# Patient Record
Sex: Female | Born: 1940 | Race: White | Hispanic: No | Marital: Married | Smoking: Never smoker
Health system: Southern US, Community
[De-identification: ages and names within clinical notes are randomized; demographics above are authoritative.]

## PROBLEM LIST (undated history)

## (undated) DIAGNOSIS — E7800 Pure hypercholesterolemia, unspecified: Secondary | ICD-10-CM

## (undated) DIAGNOSIS — C189 Malignant neoplasm of colon, unspecified: Secondary | ICD-10-CM

## (undated) DIAGNOSIS — M8580 Other specified disorders of bone density and structure, unspecified site: Secondary | ICD-10-CM

## (undated) DIAGNOSIS — F329 Major depressive disorder, single episode, unspecified: Secondary | ICD-10-CM

## (undated) DIAGNOSIS — R55 Syncope and collapse: Secondary | ICD-10-CM

## (undated) DIAGNOSIS — D721 Eosinophilia, unspecified: Secondary | ICD-10-CM

## (undated) DIAGNOSIS — T7840XA Allergy, unspecified, initial encounter: Secondary | ICD-10-CM

## (undated) DIAGNOSIS — R011 Cardiac murmur, unspecified: Secondary | ICD-10-CM

## (undated) DIAGNOSIS — I341 Nonrheumatic mitral (valve) prolapse: Secondary | ICD-10-CM

## (undated) DIAGNOSIS — F32A Depression, unspecified: Secondary | ICD-10-CM

## (undated) DIAGNOSIS — C50919 Malignant neoplasm of unspecified site of unspecified female breast: Secondary | ICD-10-CM

## (undated) DIAGNOSIS — J45909 Unspecified asthma, uncomplicated: Secondary | ICD-10-CM

## (undated) DIAGNOSIS — M1990 Unspecified osteoarthritis, unspecified site: Secondary | ICD-10-CM

## (undated) HISTORY — DX: Cardiac murmur, unspecified: R01.1

## (undated) HISTORY — DX: Major depressive disorder, single episode, unspecified: F32.9

## (undated) HISTORY — DX: Eosinophilia: D72.1

## (undated) HISTORY — PX: BACK SURGERY: SHX140

## (undated) HISTORY — DX: Depression, unspecified: F32.A

## (undated) HISTORY — PX: BREAST SURGERY: SHX581

## (undated) HISTORY — DX: Syncope and collapse: R55

## (undated) HISTORY — DX: Other specified disorders of bone density and structure, unspecified site: M85.80

## (undated) HISTORY — PX: COSMETIC SURGERY: SHX468

## (undated) HISTORY — PX: OTHER SURGICAL HISTORY: SHX169

## (undated) HISTORY — PX: ROTATOR CUFF REPAIR: SHX139

## (undated) HISTORY — DX: Unspecified asthma, uncomplicated: J45.909

## (undated) HISTORY — PX: COLON RESECTION: SHX5231

## (undated) HISTORY — DX: Allergy, unspecified, initial encounter: T78.40XA

## (undated) HISTORY — DX: Unspecified osteoarthritis, unspecified site: M19.90

## (undated) HISTORY — DX: Malignant neoplasm of colon, unspecified: C18.9

## (undated) HISTORY — DX: Malignant neoplasm of unspecified site of unspecified female breast: C50.919

## (undated) HISTORY — PX: COLON SURGERY: SHX602

## (undated) HISTORY — DX: Eosinophilia, unspecified: D72.10

## (undated) HISTORY — DX: Pure hypercholesterolemia, unspecified: E78.00

## (undated) HISTORY — DX: Nonrheumatic mitral (valve) prolapse: I34.1

## (undated) HISTORY — PX: SPINE SURGERY: SHX786

---

## 1968-03-18 HISTORY — PX: APPENDECTOMY: SHX54

## 1977-03-18 HISTORY — PX: VAGINAL HYSTERECTOMY: SUR661

## 1997-09-01 ENCOUNTER — Other Ambulatory Visit: Admission: RE | Admit: 1997-09-01 | Discharge: 1997-09-01 | Payer: Self-pay | Admitting: Cardiology

## 1998-01-26 ENCOUNTER — Other Ambulatory Visit: Admission: RE | Admit: 1998-01-26 | Discharge: 1998-01-26 | Payer: Self-pay | Admitting: Obstetrics and Gynecology

## 1998-03-18 HISTORY — PX: SHOULDER SURGERY: SHX246

## 1999-03-19 DIAGNOSIS — C50919 Malignant neoplasm of unspecified site of unspecified female breast: Secondary | ICD-10-CM

## 1999-03-19 HISTORY — DX: Malignant neoplasm of unspecified site of unspecified female breast: C50.919

## 1999-03-19 HISTORY — PX: MASTECTOMY: SHX3

## 1999-09-13 ENCOUNTER — Encounter: Admission: RE | Admit: 1999-09-13 | Discharge: 1999-09-13 | Payer: Self-pay | Admitting: Cardiology

## 1999-09-13 ENCOUNTER — Encounter (INDEPENDENT_AMBULATORY_CARE_PROVIDER_SITE_OTHER): Payer: Self-pay | Admitting: Specialist

## 1999-09-13 ENCOUNTER — Encounter: Payer: Self-pay | Admitting: Cardiology

## 1999-09-13 ENCOUNTER — Other Ambulatory Visit: Admission: RE | Admit: 1999-09-13 | Discharge: 1999-09-13 | Payer: Self-pay | Admitting: Cardiology

## 1999-09-21 ENCOUNTER — Encounter: Payer: Self-pay | Admitting: General Surgery

## 1999-09-24 ENCOUNTER — Inpatient Hospital Stay (HOSPITAL_COMMUNITY): Admission: RE | Admit: 1999-09-24 | Discharge: 1999-09-27 | Payer: Self-pay | Admitting: General Surgery

## 1999-09-24 ENCOUNTER — Encounter: Payer: Self-pay | Admitting: General Surgery

## 1999-10-08 ENCOUNTER — Ambulatory Visit (HOSPITAL_COMMUNITY): Admission: RE | Admit: 1999-10-08 | Discharge: 1999-10-08 | Payer: Self-pay | Admitting: *Deleted

## 1999-10-08 ENCOUNTER — Encounter: Payer: Self-pay | Admitting: *Deleted

## 1999-10-15 ENCOUNTER — Encounter: Payer: Self-pay | Admitting: General Surgery

## 1999-10-15 ENCOUNTER — Ambulatory Visit (HOSPITAL_BASED_OUTPATIENT_CLINIC_OR_DEPARTMENT_OTHER): Admission: RE | Admit: 1999-10-15 | Discharge: 1999-10-15 | Payer: Self-pay | Admitting: General Surgery

## 1999-12-13 ENCOUNTER — Inpatient Hospital Stay (HOSPITAL_COMMUNITY): Admission: EM | Admit: 1999-12-13 | Discharge: 1999-12-17 | Payer: Self-pay | Admitting: Emergency Medicine

## 1999-12-14 ENCOUNTER — Encounter: Payer: Self-pay | Admitting: Oncology

## 2000-01-01 ENCOUNTER — Encounter: Payer: Self-pay | Admitting: *Deleted

## 2000-01-01 ENCOUNTER — Encounter: Admission: RE | Admit: 2000-01-01 | Discharge: 2000-01-01 | Payer: Self-pay | Admitting: *Deleted

## 2000-02-22 ENCOUNTER — Encounter: Admission: RE | Admit: 2000-02-22 | Discharge: 2000-05-22 | Payer: Self-pay | Admitting: Oncology

## 2000-03-20 ENCOUNTER — Ambulatory Visit (HOSPITAL_BASED_OUTPATIENT_CLINIC_OR_DEPARTMENT_OTHER): Admission: RE | Admit: 2000-03-20 | Discharge: 2000-03-20 | Payer: Self-pay | Admitting: General Surgery

## 2000-04-17 ENCOUNTER — Other Ambulatory Visit: Admission: RE | Admit: 2000-04-17 | Discharge: 2000-04-17 | Payer: Self-pay | Admitting: Obstetrics and Gynecology

## 2000-04-24 ENCOUNTER — Encounter: Admission: RE | Admit: 2000-04-24 | Discharge: 2000-04-24 | Payer: Self-pay | Admitting: *Deleted

## 2000-04-24 ENCOUNTER — Encounter: Payer: Self-pay | Admitting: *Deleted

## 2000-07-17 ENCOUNTER — Encounter: Payer: Self-pay | Admitting: *Deleted

## 2000-07-17 ENCOUNTER — Ambulatory Visit (HOSPITAL_COMMUNITY): Admission: RE | Admit: 2000-07-17 | Discharge: 2000-07-17 | Payer: Self-pay | Admitting: *Deleted

## 2000-07-30 ENCOUNTER — Ambulatory Visit (HOSPITAL_COMMUNITY): Admission: RE | Admit: 2000-07-30 | Discharge: 2000-07-30 | Payer: Self-pay | Admitting: *Deleted

## 2000-07-30 ENCOUNTER — Encounter: Payer: Self-pay | Admitting: *Deleted

## 2001-04-16 ENCOUNTER — Other Ambulatory Visit: Admission: RE | Admit: 2001-04-16 | Discharge: 2001-04-16 | Payer: Self-pay | Admitting: Obstetrics and Gynecology

## 2001-04-22 ENCOUNTER — Encounter (INDEPENDENT_AMBULATORY_CARE_PROVIDER_SITE_OTHER): Payer: Self-pay | Admitting: *Deleted

## 2001-04-22 ENCOUNTER — Ambulatory Visit (HOSPITAL_COMMUNITY): Admission: RE | Admit: 2001-04-22 | Discharge: 2001-04-22 | Payer: Self-pay | Admitting: Gastroenterology

## 2001-04-27 ENCOUNTER — Encounter: Admission: RE | Admit: 2001-04-27 | Discharge: 2001-04-27 | Payer: Self-pay | Admitting: General Surgery

## 2001-04-27 ENCOUNTER — Encounter: Payer: Self-pay | Admitting: General Surgery

## 2001-11-24 ENCOUNTER — Encounter: Admission: RE | Admit: 2001-11-24 | Discharge: 2001-11-24 | Payer: Self-pay | Admitting: Gastroenterology

## 2001-11-24 ENCOUNTER — Encounter: Payer: Self-pay | Admitting: Gastroenterology

## 2001-12-07 ENCOUNTER — Encounter (INDEPENDENT_AMBULATORY_CARE_PROVIDER_SITE_OTHER): Payer: Self-pay | Admitting: Specialist

## 2001-12-07 ENCOUNTER — Ambulatory Visit (HOSPITAL_COMMUNITY): Admission: RE | Admit: 2001-12-07 | Discharge: 2001-12-07 | Payer: Self-pay | Admitting: Gastroenterology

## 2001-12-30 ENCOUNTER — Encounter: Payer: Self-pay | Admitting: Gastroenterology

## 2001-12-30 ENCOUNTER — Encounter: Admission: RE | Admit: 2001-12-30 | Discharge: 2001-12-30 | Payer: Self-pay | Admitting: Gastroenterology

## 2002-05-11 ENCOUNTER — Encounter: Payer: Self-pay | Admitting: General Surgery

## 2002-05-11 ENCOUNTER — Encounter: Admission: RE | Admit: 2002-05-11 | Discharge: 2002-05-11 | Payer: Self-pay | Admitting: General Surgery

## 2003-05-16 ENCOUNTER — Encounter: Admission: RE | Admit: 2003-05-16 | Discharge: 2003-05-16 | Payer: Self-pay | Admitting: General Surgery

## 2003-06-08 ENCOUNTER — Other Ambulatory Visit: Admission: RE | Admit: 2003-06-08 | Discharge: 2003-06-08 | Payer: Self-pay | Admitting: Obstetrics and Gynecology

## 2003-07-29 ENCOUNTER — Ambulatory Visit (HOSPITAL_COMMUNITY): Admission: RE | Admit: 2003-07-29 | Discharge: 2003-07-29 | Payer: Self-pay | Admitting: Oncology

## 2004-05-29 ENCOUNTER — Encounter: Admission: RE | Admit: 2004-05-29 | Discharge: 2004-05-29 | Payer: Self-pay | Admitting: General Surgery

## 2004-06-11 ENCOUNTER — Other Ambulatory Visit: Admission: RE | Admit: 2004-06-11 | Discharge: 2004-06-11 | Payer: Self-pay | Admitting: Addiction Medicine

## 2004-07-27 ENCOUNTER — Ambulatory Visit: Payer: Self-pay | Admitting: Oncology

## 2004-08-08 ENCOUNTER — Ambulatory Visit (HOSPITAL_COMMUNITY): Admission: RE | Admit: 2004-08-08 | Discharge: 2004-08-08 | Payer: Self-pay | Admitting: Oncology

## 2005-05-31 ENCOUNTER — Encounter: Admission: RE | Admit: 2005-05-31 | Discharge: 2005-05-31 | Payer: Self-pay | Admitting: Obstetrics and Gynecology

## 2005-06-12 ENCOUNTER — Other Ambulatory Visit: Admission: RE | Admit: 2005-06-12 | Discharge: 2005-06-12 | Payer: Self-pay | Admitting: Addiction Medicine

## 2005-09-23 ENCOUNTER — Ambulatory Visit: Payer: Self-pay | Admitting: Oncology

## 2005-09-30 LAB — CBC WITH DIFFERENTIAL/PLATELET
BASO%: 0.6 % (ref 0.0–2.0)
EOS%: 5.4 % (ref 0.0–7.0)
MCH: 31 pg (ref 26.0–34.0)
MCHC: 34.7 g/dL (ref 32.0–36.0)
MCV: 89.3 fL (ref 81.0–101.0)
MONO%: 7.8 % (ref 0.0–13.0)
RBC: 4.64 10*6/uL (ref 3.70–5.32)
RDW: 12.2 % (ref 11.3–14.5)
lymph#: 2 10*3/uL (ref 0.9–3.3)

## 2005-10-01 ENCOUNTER — Ambulatory Visit (HOSPITAL_COMMUNITY): Admission: RE | Admit: 2005-10-01 | Discharge: 2005-10-01 | Payer: Self-pay | Admitting: Oncology

## 2005-10-03 LAB — COMPREHENSIVE METABOLIC PANEL
ALT: 21 U/L (ref 0–40)
AST: 27 U/L (ref 0–37)
Albumin: 4.6 g/dL (ref 3.5–5.2)
Alkaline Phosphatase: 54 U/L (ref 39–117)
BUN: 15 mg/dL (ref 6–23)
Calcium: 9.9 mg/dL (ref 8.4–10.5)
Chloride: 102 mEq/L (ref 96–112)
Potassium: 4.2 mEq/L (ref 3.5–5.3)

## 2005-10-03 LAB — IMMUNOFIXATION ELECTROPHORESIS
IgA: 113 mg/dL (ref 68–378)
IgM, Serum: 143 mg/dL (ref 60–263)
Total Protein, Serum Electrophoresis: 7.3 g/dL (ref 6.0–8.3)

## 2005-10-03 LAB — URIC ACID: Uric Acid, Serum: 5.4 mg/dL (ref 2.4–7.0)

## 2006-06-09 ENCOUNTER — Encounter: Admission: RE | Admit: 2006-06-09 | Discharge: 2006-06-09 | Payer: Self-pay | Admitting: General Surgery

## 2006-10-23 ENCOUNTER — Encounter (INDEPENDENT_AMBULATORY_CARE_PROVIDER_SITE_OTHER): Payer: Self-pay | Admitting: Orthopedic Surgery

## 2006-10-23 ENCOUNTER — Ambulatory Visit (HOSPITAL_BASED_OUTPATIENT_CLINIC_OR_DEPARTMENT_OTHER): Admission: RE | Admit: 2006-10-23 | Discharge: 2006-10-23 | Payer: Self-pay | Admitting: Orthopedic Surgery

## 2007-06-16 ENCOUNTER — Encounter: Admission: RE | Admit: 2007-06-16 | Discharge: 2007-06-16 | Payer: Self-pay | Admitting: Cardiology

## 2007-07-07 ENCOUNTER — Encounter: Admission: RE | Admit: 2007-07-07 | Discharge: 2007-07-07 | Payer: Self-pay | Admitting: Cardiology

## 2007-07-14 ENCOUNTER — Other Ambulatory Visit: Admission: RE | Admit: 2007-07-14 | Discharge: 2007-07-14 | Payer: Self-pay | Admitting: Obstetrics and Gynecology

## 2007-12-14 ENCOUNTER — Ambulatory Visit: Payer: Self-pay | Admitting: Internal Medicine

## 2007-12-14 DIAGNOSIS — R05 Cough: Secondary | ICD-10-CM

## 2007-12-14 DIAGNOSIS — R059 Cough, unspecified: Secondary | ICD-10-CM | POA: Insufficient documentation

## 2007-12-14 DIAGNOSIS — Z853 Personal history of malignant neoplasm of breast: Secondary | ICD-10-CM

## 2007-12-14 DIAGNOSIS — I059 Rheumatic mitral valve disease, unspecified: Secondary | ICD-10-CM

## 2007-12-14 DIAGNOSIS — J45909 Unspecified asthma, uncomplicated: Secondary | ICD-10-CM | POA: Insufficient documentation

## 2007-12-14 DIAGNOSIS — J309 Allergic rhinitis, unspecified: Secondary | ICD-10-CM | POA: Insufficient documentation

## 2007-12-14 DIAGNOSIS — E782 Mixed hyperlipidemia: Secondary | ICD-10-CM

## 2008-01-13 ENCOUNTER — Ambulatory Visit: Payer: Self-pay | Admitting: Internal Medicine

## 2008-01-13 DIAGNOSIS — R0602 Shortness of breath: Secondary | ICD-10-CM | POA: Insufficient documentation

## 2008-01-19 ENCOUNTER — Ambulatory Visit: Payer: Self-pay | Admitting: Internal Medicine

## 2008-01-19 LAB — CONVERTED CEMR LAB
BUN: 11 mg/dL (ref 6–23)
Calcium: 10.6 mg/dL — ABNORMAL HIGH (ref 8.4–10.5)
Creatinine, Ser: 0.8 mg/dL (ref 0.4–1.2)
Glucose, Bld: 87 mg/dL (ref 70–99)

## 2008-01-25 ENCOUNTER — Ambulatory Visit: Payer: Self-pay | Admitting: Cardiology

## 2008-01-29 ENCOUNTER — Telehealth: Payer: Self-pay | Admitting: Internal Medicine

## 2008-02-01 ENCOUNTER — Telehealth: Payer: Self-pay | Admitting: Internal Medicine

## 2008-02-02 ENCOUNTER — Telehealth: Payer: Self-pay | Admitting: Internal Medicine

## 2008-04-18 ENCOUNTER — Ambulatory Visit: Payer: Self-pay | Admitting: Internal Medicine

## 2008-07-11 ENCOUNTER — Encounter: Admission: RE | Admit: 2008-07-11 | Discharge: 2008-07-11 | Payer: Self-pay | Admitting: Cardiology

## 2008-08-30 ENCOUNTER — Ambulatory Visit: Payer: Self-pay | Admitting: Obstetrics and Gynecology

## 2008-08-30 ENCOUNTER — Other Ambulatory Visit: Admission: RE | Admit: 2008-08-30 | Discharge: 2008-08-30 | Payer: Self-pay | Admitting: Obstetrics and Gynecology

## 2008-08-30 ENCOUNTER — Encounter: Payer: Self-pay | Admitting: Obstetrics and Gynecology

## 2008-12-20 ENCOUNTER — Encounter: Admission: RE | Admit: 2008-12-20 | Discharge: 2008-12-20 | Payer: Self-pay | Admitting: Cardiology

## 2009-04-17 ENCOUNTER — Encounter: Admission: RE | Admit: 2009-04-17 | Discharge: 2009-04-17 | Payer: Self-pay | Admitting: Surgery

## 2009-06-07 ENCOUNTER — Ambulatory Visit: Payer: Self-pay | Admitting: Obstetrics and Gynecology

## 2009-06-08 ENCOUNTER — Ambulatory Visit: Payer: Self-pay | Admitting: Obstetrics and Gynecology

## 2009-07-17 ENCOUNTER — Encounter: Admission: RE | Admit: 2009-07-17 | Discharge: 2009-07-17 | Payer: Self-pay | Admitting: Obstetrics and Gynecology

## 2009-08-08 ENCOUNTER — Ambulatory Visit: Payer: Self-pay | Admitting: Obstetrics and Gynecology

## 2009-10-09 ENCOUNTER — Ambulatory Visit: Payer: Self-pay | Admitting: Obstetrics and Gynecology

## 2009-10-09 ENCOUNTER — Other Ambulatory Visit: Admission: RE | Admit: 2009-10-09 | Discharge: 2009-10-09 | Payer: Self-pay | Admitting: Obstetrics and Gynecology

## 2009-11-06 ENCOUNTER — Ambulatory Visit: Payer: Self-pay | Admitting: Obstetrics and Gynecology

## 2009-12-12 ENCOUNTER — Ambulatory Visit: Payer: Self-pay | Admitting: Cardiology

## 2009-12-13 ENCOUNTER — Ambulatory Visit: Payer: Self-pay | Admitting: Cardiology

## 2009-12-20 ENCOUNTER — Ambulatory Visit: Payer: Self-pay | Admitting: Cardiology

## 2009-12-20 ENCOUNTER — Ambulatory Visit: Admission: RE | Admit: 2009-12-20 | Discharge: 2009-12-20 | Payer: Self-pay | Admitting: Gynecologic Oncology

## 2010-03-18 DIAGNOSIS — C189 Malignant neoplasm of colon, unspecified: Secondary | ICD-10-CM

## 2010-03-18 HISTORY — DX: Malignant neoplasm of colon, unspecified: C18.9

## 2010-05-01 ENCOUNTER — Ambulatory Visit (INDEPENDENT_AMBULATORY_CARE_PROVIDER_SITE_OTHER): Payer: Medicare Other | Admitting: Cardiology

## 2010-05-01 DIAGNOSIS — E7800 Pure hypercholesterolemia, unspecified: Secondary | ICD-10-CM

## 2010-05-01 DIAGNOSIS — Z79899 Other long term (current) drug therapy: Secondary | ICD-10-CM

## 2010-05-01 DIAGNOSIS — R55 Syncope and collapse: Secondary | ICD-10-CM

## 2010-05-08 ENCOUNTER — Other Ambulatory Visit: Payer: MEDICARE

## 2010-05-08 ENCOUNTER — Ambulatory Visit (INDEPENDENT_AMBULATORY_CARE_PROVIDER_SITE_OTHER): Payer: MEDICARE | Admitting: Obstetrics and Gynecology

## 2010-05-08 DIAGNOSIS — D3910 Neoplasm of uncertain behavior of unspecified ovary: Secondary | ICD-10-CM

## 2010-05-08 DIAGNOSIS — N83209 Unspecified ovarian cyst, unspecified side: Secondary | ICD-10-CM

## 2010-05-08 DIAGNOSIS — N949 Unspecified condition associated with female genital organs and menstrual cycle: Secondary | ICD-10-CM

## 2010-06-28 ENCOUNTER — Other Ambulatory Visit: Payer: Self-pay | Admitting: Obstetrics and Gynecology

## 2010-06-28 DIAGNOSIS — Z1231 Encounter for screening mammogram for malignant neoplasm of breast: Secondary | ICD-10-CM

## 2010-07-03 ENCOUNTER — Other Ambulatory Visit: Payer: Self-pay | Admitting: *Deleted

## 2010-07-03 MED ORDER — BUTALBITAL-ASPIRIN-CAFFEINE 50-325-40 MG PO CAPS: 1.0000 | ORAL_CAPSULE | ORAL | Status: DC | PRN

## 2010-07-03 NOTE — Telephone Encounter (Signed)
Refill request faxed to [place], faxed signed authorization back

## 2010-07-11 ENCOUNTER — Telehealth: Payer: Self-pay | Admitting: Cardiology

## 2010-07-11 DIAGNOSIS — R42 Dizziness and giddiness: Secondary | ICD-10-CM

## 2010-07-11 MED ORDER — MECLIZINE HCL 25 MG PO TABS: 25.0000 mg | ORAL_TABLET | Freq: Three times a day (TID) | ORAL | Status: DC | PRN

## 2010-07-11 NOTE — Telephone Encounter (Signed)
Very dizzy, worse with movement.  Spoke with Lawson Fiscal and will work she will see her Friday and ok to try Meclizine PRN.  Symptoms have been going on for a couple of weeks

## 2010-07-11 NOTE — Telephone Encounter (Signed)
Patient says that she is very dizzy and stumbling around.  She wants to know if she should come in to be seen.

## 2010-07-13 ENCOUNTER — Encounter: Payer: Self-pay | Admitting: Nurse Practitioner

## 2010-07-13 ENCOUNTER — Ambulatory Visit (INDEPENDENT_AMBULATORY_CARE_PROVIDER_SITE_OTHER): Payer: MEDICARE | Admitting: Nurse Practitioner

## 2010-07-13 VITALS — BP 138/80 | HR 72 | Ht 64.0 in | Wt 140.0 lb

## 2010-07-13 DIAGNOSIS — I059 Rheumatic mitral valve disease, unspecified: Secondary | ICD-10-CM

## 2010-07-13 DIAGNOSIS — H8110 Benign paroxysmal vertigo, unspecified ear: Secondary | ICD-10-CM

## 2010-07-13 DIAGNOSIS — IMO0002 Reserved for concepts with insufficient information to code with codable children: Secondary | ICD-10-CM

## 2010-07-13 NOTE — Progress Notes (Signed)
   Shelby [name] Date of Birth: 09-20-1940   History of Present Illness: Shelby [name] is seen today for a work in visit. She is seen for Dr. Patty Sermons. She has been dizzy for the past several weeks. It has gotten progressively worse since she had a syncopal episode back in February. It has gotten really worse over the past two weeks. She is dizzy with lying down, turning over and even turning her head. Her right ear feels stopped up. She denies palpitations, chest pain or shortness of breath. She has had no further syncope. She was given some Meclizine and it has helped her but she is not taking it regularly.   Current Outpatient Prescriptions on File Prior to Visit  Medication Sig Dispense Refill  . butalbital-aspirin-caffeine (FIORINAL) 50-325-40 MG per capsule Take 1 capsule by mouth every 4 (four) hours as needed for headache.  30 capsule  5  . meclizine (ANTIVERT) 25 MG tablet Take 1 tablet (25 mg total) by mouth 3 (three) times daily as needed for dizziness.  30 tablet  0    Allergies  Allergen Reactions  . Benzonatate     REACTION: migraines  . Codeine     REACTION: Nausea  . Latex   . Penicillins   . Sulfonamide Derivatives     Past Medical History  Diagnosis Date  . MVP (mitral valve prolapse)   . Migraine   . Breast cancer 2001    s/p surgery and chemo   . Eosinophilia   . Syncope     secondary to vasovagal phenomenon  . Hypercholesterolemia     statin intolerant  . Ovarian cyst     Past Surgical History  Procedure Date  . Mastectomy     History  Smoking status  . Never Smoker   Smokeless tobacco  . Never Used    History  Alcohol Use No    Family History  Problem Relation Age of Onset  . Arthritis Mother   . Heart disease Father     Review of Systems: The review of systems is positive for positional vertigo. She denies chest pain or shortness of breath. She is not having palpitations She has had no further passing out spells.  All other systems  were reviewed and are negative.  Physical Exam: BP 138/80  Pulse 72  Ht 5\' 4"  (1.626 m)  Wt 140 lb (63.504 kg)  BMI 24.03 kg/m2 Patient is very pleasant and in no acute distress. She looks much younger than her stated age. Skin is warm and dry. Color is normal.  HEENT is unremarkable. Normocephalic/atraumatic. PERRL. Sclera are nonicteric. Neck is supple. No masses. No JVD. Lungs are clear. Cardiac exam shows a regular rate and rhythm.I could not appreciate a click today.  Abdomen is soft. Extremities are without edema. Gait and ROM are intact. No gross neurologic deficits noted.  LABORATORY DATA: N/A   Assessment / Plan:

## 2010-07-13 NOTE — Assessment & Plan Note (Signed)
She did not have MVP by her last echo in 2005. May need to consider updating at her next appointment.

## 2010-07-13 NOTE — Patient Instructions (Signed)
Use the meclizine three times a day Try your valium at night for the next few nights. Call for any problems.

## 2010-07-13 NOTE — Assessment & Plan Note (Signed)
Her symptoms are consistent with positional vertigo. I will have her use the Meclizine TID regularly. I have asked her to use her Valium at night as well. I think her symptoms will resolve with time. Patient is agreeable to this plan and will call if any problems develop in the interim.

## 2010-07-25 ENCOUNTER — Telehealth: Payer: Self-pay | Admitting: Cardiology

## 2010-07-25 NOTE — Telephone Encounter (Addendum)
PT SAW LORI RECENTLY AND TOLD HAD VERTIGO. PT HAS TAKEN 32 Meclizine  AND NO BETTER, NO SURE WHAT SHE NEEDS TO DO NOW. CHART PLACED IN BOX.

## 2010-07-25 NOTE — Telephone Encounter (Signed)
Discussed with Lawson Fiscal and will refer to ENT.  Will call tomorrow for an appointment

## 2010-07-26 ENCOUNTER — Telehealth: Payer: Self-pay | Admitting: *Deleted

## 2010-07-26 NOTE — Telephone Encounter (Signed)
Spoke with patient re- appt. With ENT Dr. Narda Bonds May 24th at 1:00,if she gets any worse re- vertigo go to urgent care

## 2010-07-31 NOTE — Op Note (Signed)
NAMEELESA, GARMAN             ACCOUNT NO.:  [id number]   MEDICAL RECORD NO.:  [id number]          PATIENT TYPE:  AMB   LOCATION:  DSC                          FACILITY:  [facility]   PHYSICIAN:  Katy Fitch. Sypher, M.D. DATE OF BIRTH:  Jul 17, 1940   DATE OF PROCEDURE:  10/23/2006  DATE OF DISCHARGE:                               OPERATIVE REPORT   PREOPERATIVE DIAGNOSIS:  Painful swollen right small finger DIP joint,  rule out mucoid cyst, with preoperative x-rays revealing bone-on-bone  arthropathy.   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   OPERATION:  1. Debridement of right small finger DIP joint with synovectomy.  2. Resection of marginal osteophytes at base of distal phalanx right      small finger.   SURGEON:  Katy Fitch. Sypher, M.D.   ASSISTANT:  Marveen Reeks. Dasnoit, P.A.-C.   ANESTHESIA:  2% lidocaine metacarpal head level block of right small  finger.   SUPERVISING ANESTHESIOLOGIST:  Janetta Hora. Gelene Mink, M.D.   INDICATIONS:  Girl Schissler is a 70 year old right handed woman  referred through the courtesy of Dr. Ronny Flurry for evaluation and  management of a painful right small finger swelling.  Clinical  examination revealed background osteoarthritis and what appeared to be a  large mucoid or synovial cyst on the DIP joint line.  X-rays of her  finger in the office demonstrated bone-on-bone arthropathy at the right  small finger DIP joint with significant ulnar translation of the distal  phalanx and a large marginal osteophyte at the distal phalangeal ulnar  condyle.  Ms. Rausch requested excisional biopsy of her mass.  After  informed consent, she is brought to the operating room at this time.   PROCEDURE:  Hermina Barnard is brought to the operating room and placed  in a supine position on the operating table.  Following light sedation,  a 2% lidocaine metacarpal head level  block was placed without  complication.  Ms. Mackintosh' right arm was prepped with Betadine soap  solution and sterilely draped.  Following exsanguination of the right  small finger with an Esmarch wrap, a Penrose drain was placed over the  proximal phalangeal segment of the finger as a digital tourniquet.  The  procedure commenced with two curvilinear incisions exposing the radial  and ulnar aspects of the DIP joint.  The subcutaneous tissues were  carefully divided revealing a bulging capsule with inflammatory  tenosynovium extruding from beneath the extensor mechanism.   A meticulous synovectomy of the DIP joint was accomplished from the  dorsal radial and dorsal ulnar aspect by capsulectomy between the  terminal extensor tendon and the radial and ulnar collateral ligaments.  All of the pathologic synovium accessible through this dorsal approach  was removed with a fine rongeur and a curet.  A microcuret was then used  to remove the marginal osteophytes at the base of the distal phalanx.   The distal interphalangeal joint was meticulously debrided with a dental  needle and a 20 mL syringe with sterile saline pressure irrigation.  All  debris from  the joint was removed.  After assuring that the marginal  osteophytes had been properly smoothed, the synovium removed was passed  off for pathologic evaluation.  The wound was then repaired with  interrupted sutures of 5-0 nylon in both the radial and ulnar incisions.  There were no apparent complications.   Ms. Lewers tolerated the surgery and anesthesia well.  She is  transferred to the recovery room with stable vital signs.      Katy Fitch Sypher, M.D.  Electronically Signed     RVS/MEDQ  D:  10/23/2006  T:  10/23/2006  Job:  [id number]

## 2010-08-03 NOTE — Discharge Summary (Signed)
[place]  Patient:    Shelby [name], Shelby [name]                    MRN: [id number] Adm. Date:  [id number] Disc. Date: 09/27/99 Attending:  Janalyn Rouse                           Discharge Summary  CHIEF COMPLAINT: Right breast cancer.  HISTORY OF PRESENT ILLNESS: Shelby [name] is a 70 year old woman admitted jointly by Dr. Francina Ames and Dr. Desmond Dike.  She had a diagnosis of right breast cancer made several days prior to admission.  She is admitted to undergo elective right total mastectomy, sentinel node dissection, and immediate right breast reconstruction.  HOSPITAL COURSE: On the day of admission the patient was taken to the operating room and underwent right total mastectomy and sentinel node biopsy by Dr. Francina Ames.  The initial frozen sections on the sentinel node were negative.  Dr. Dan Humphreys then performed an immediate right breast reconstruction with an ipsilateral pedicle TRAM flap.  The patients initial postoperative course was very benign.  However, on postoperative day #2 she began to complain of left medial knee pain.  She was sent for venous Doppler examination, and had a syncopal episode.  She was transported back to the ward, where her vital signs remained stable.  A bedside Doppler examination was carried out, which failed to show any evidence of deep vein thrombosis, superficial thrombus, or Bakers cyst.  The patients course from that point on has been uneventful.  She was evaluated while in the hospital by Dr. Patty Sermons.  It was felt that her syncopal episodes was probably secondary to a vasovagal reaction.  At the time of her discharge her wounds had a satisfactory appearance, she was ambulating without difficulty, and has been able to void without any problems.  FINAL DIAGNOSIS: Right breast cancer, final pathology pending.  OPERATIONS/PROCEDURES:  1. Right total mastectomy and sentinel lymph node biopsy  performed by     Dr. Francina Ames.  2. Immediate right breast reconstruction (post mastectomy), with ipsilateral,     pedicled TRAM flap by Dr. Desmond Dike.  DISPOSITION: The patient is discharged home.  She will be cared for by her husband.  DISCHARGE MEDICATIONS:  1. Percocet 1-2 p.o. q.4h p.r.n. pain (#30).  2. Ciprofloxacin 500 mg p.o. b.i.d. (#14).  3. Multivitamin with iron.  WOUND CARE: I have gone over wound care with her.  FOLLOW-UP: I will plan on seeing her back in the office on Tuesday, October 02, 1999. DD:  09/27/99 TD:  09/27/99 Job: [id number]

## 2010-08-03 NOTE — Op Note (Signed)
. [facility]  Patient:    Shelby [name], Shelby [name]                     MRN: [id number] Proc. Date: 09/24/99 Attending:  Rose Phi. Maple Hudson, M.D. Dictator:   Rose Phi. Maple Hudson, M.D.                           Operative Report  PREOPERATIVE DIAGNOSIS:  Carcinoma of the right breast.  POSTOPERATIVE DIAGNOSIS:  Carcinoma of the right breast.  OPERATION:  Right total mastectomy with sentinel lymph node biopsy and blue dye injection.  SURGEON:  Dr. Francina Ames.  ANESTHESIA:  General.  The patient had an ultrasound guided core biopsy of a palpable mass in the subareolar area of her right breast which showed an infiltrated ductal carcinoma.  It is a small breast, and in its location it was felt that lumpectomy would not give an acceptable cosmetic result, and it was recommended that she undergo a mastectomy.  She has been seen for consideration for reconstruction which will be done.  Prior to coming to the operating room 1 millicurie of ______ sulfur ______ was injected intradermally over the palpable tumor.  DESCRIPTION OF PROCEDURE:  After suitable general anesthesia was induced the patient was placed in the supine position with both arms extended on the arm board.  The entire abdomen and chest were prepped and draped out.  She had been previously marked for the tram reconstruction.  Prior to prepping and draping, 5 cc of ______  blue was injected in the subareolar area and breast massaged for 5 minutes.  A short transverse axillary incision was made with dissection through the subcutaneous tissue through the clavipectoral fascia.  A self-retaining retractor was inserted.  The probe was used as a guide to a hot and very blue node which we excised and used clips to clip the lymphatics.  One other node was also identified.  These were both blue and hot.  They were submitted to the pathologist for evaluation for touch preps.  While that was being done, a  circular incision around the areola was then made, and a little wider on the inferior side where the palpable tumor was. The limb was extended medially and laterally to make this a skin sparing mastectomy.  We then carefully dissected the superior flap to the upper margin of breast tissue near the clavicle, then medially to the sternum, and inferiorly at the inframammary fold and the rectus fascia, and laterally to the latissimus dorsi muscle.  We then removed the breast by dissecting from medial to lateral, incorporated the pectoralis fascia.  At the time we got to the lateral margin of the pectoralis major muscle, the pathologist reported the sentinel nodes as negative, so an axillary dissection would be unnecessary.  We were then able to terminate the mastectomy in the axilla. Hemostasis obtained with the cautery.  Dr. Desmond Dike then came in to do the tram reconstruction, and that will be submitted in a separate note. DD:  09/24/99 TD:  09/24/99 Job: [id number]

## 2010-08-03 NOTE — Op Note (Signed)
[place]  Patient:    Shelby [name], Shelby [name]                    MRN: [id number] Proc. Date: 03/20/00 Adm. Date:  [id number] Attending:  Janalyn Rouse                           Operative Report  PREOPERATIVE DIAGNOSIS:  History of carcinoma of the breast.  POSTOPERATIVE DIAGNOSIS:  History of carcinoma of the breast.  OPERATION PERFORMED:  Removal of Port-A-Cath.  SURGEON:  Rose Phi. Maple Hudson, M.D.  ANESTHESIA:  MAC.  DESCRIPTION OF PROCEDURE:  Patient placed on the operating table and the left upper chest prepped and draped in the usual sterile fashion.  The old incision was infiltrated with 1% Xylocaine with Adrenalin and incision made and we exposed the implantable port.  I lifted up on the nozzle end of it and removed the catheter and then using that as traction, I divided the two stitches that anchored it in place and removed the implantable port.  We had good hemostasis.  A subcuticular closure of 4-0 Monocryl was carried out with Dermabond closure.  She was then transferred to the recovery room in satisfactory condition having tolerated the procedure well. DD:  03/20/00 TD:  03/20/00 Job: [id number]

## 2010-08-03 NOTE — Op Note (Signed)
   NAME:  Shelby [name], Shelby [name]                       ACCOUNT NO.:  [id number]   MEDICAL RECORD NO.:  [id number]                   PATIENT TYPE:  AMB   LOCATION:  ENDO                                 FACILITY:  [facility]   PHYSICIAN:  Florencia Reasons, M.D.             DATE OF BIRTH:  07-Apr-1940   DATE OF PROCEDURE:  12/07/2001  DATE OF DISCHARGE:                                 OPERATIVE REPORT   PROCEDURE:  Colonoscopy with biopsies.   INDICATIONS:  A 70 year old female with unexplained abdominal pain and  diarrhea.   FINDINGS:  Normal examination to the terminal ileum.   PROCEDURE:  The nature, purpose, and risks of the procedure had been  discussed with the patient and who provided written consent .   SEDATION:  Fentanyl 90 mcg and Versed 9 mg IV without arrhythmias or  desaturation.   The Olympus adult video colonoscope was advanced to the terminal ileum  without significant difficulty, albeit with a moderate amount of looping  despite using the stiffener on the adjustable tension pediatric colonoscope.  It is felt that the adult colonoscope would probably be a better instrument  for future occasions.   The terminal ileum had a normal appearance and was biopsied several times  prior to removal of the scope.  It was withdrawn in a systematic fashion  through the colon which had an excellent prep so it is felt that all areas  were well-seen.   This was a normal examination.  No colitis, polyps, cancer, vascular  malformations or diverticulosis were noted.  Random mucosal biopsies were  obtained along the length of the colon.   The patient tolerated the procedure well and there were no apparent  complications.   IMPRESSION:  Normal colonoscopy.  No source of abdominal pain or diarrhea  evident.   PLAN:  Await pathology on the biopsies.                                               Florencia Reasons, M.D.    RVB/MEDQ  D:  12/07/2001  T:  12/08/2001  Job:  [id number]   cc:    Thomas A. Patty Sermons, M.D.

## 2010-08-03 NOTE — Consult Note (Signed)
[place]. [facility]  Patient:    Shelby [name], Shelby [name]                    MRN: [id number] Adm. Date:  [id number] Attending:  Aliene Altes                          Consultation Report  HISTORY OF PRESENT ILLNESS:  Ms. Canady was admitted approximately five days ago for an episode of febrile neutropenia, status post her secondary cycle of Adriamycin/Cytoxan therapy for her breast cancer.  She was started on Primaxin and Neupogen.  She has done quite well and she has remained afebrile for the past 48 hours.  Her ANC today is 800.  She is discharged home in good condition with specific instructions to wear a mask when outside, but to remain inside for the next day.  She is going to follow up with [place] in the clinic in the next two days for a repeat CBC to see how she is doing.  She is going to remain on p.o. antibiotics.  She has no restrictions on her activities and she is resuming a regular diet. DD:  12/17/99 TD:  12/17/99 Job: [id number]

## 2010-08-03 NOTE — Op Note (Signed)
   NAME:  Shelby [name], Shelby [name]                       ACCOUNT NO.:  [id number]   MEDICAL RECORD NO.:  [id number]                   PATIENT TYPE:  AMB   LOCATION:  ENDO                                 FACILITY:  [facility]   PHYSICIAN:  Florencia Reasons, M.D.             DATE OF BIRTH:  [birth year]   DATE OF PROCEDURE:  12/07/2001  DATE OF DISCHARGE:                                 OPERATIVE REPORT   PROCEDURE:  Upper endoscopy with biopsies.   SURGEON:  Florencia Reasons, M.D.   INDICATIONS:  A 70 year old female with diarrhea and diffuse, nonspecific  abdominal pain.   FINDINGS:  Normal exam except small hiatal hernia.   PROCEDURE:  The nature and proficiency of the procedure had been discussed  with the patient, who provided written consent.  Sedation with Fentanyl 60  mcg and Versed 6 mg IV without arrhythmias or desaturation.  The Olympus  video endoscope was passed under direct vision without difficulty.  The  esophagus was readily entered without significant difficulty and was noted  to be normal in its entire length without evidence of reflux esophagitis,  free reflux, varices, infection or neoplasia.  There was a sharply  demarcated squamocolumnar junction possibly representing a widely patent  esophageal ring, and below this, was a 2.0 cm hiatal hernia.   The stomach contained no significant residual and had normal mucosa without  evidence of gastritis, erosions, ulcers, polyps or masses and a retroflexed  view of the proximal stomach was normal.  The pylorus, duodenal bulb and  second duodenum looked normal.  Multiple biopsies from the duodenum were  obtained prior to removal of the scope.  The patient tolerated the procedure  well and there were no apparent complications.   IMPRESSION:  Small hiatal hernia, otherwise, normal endoscopy, without  source of abdominal pain or diarrhea evident.   PLAN:  Await pathology on random duodenal biopsies and proceed to  colonoscopic  evaluation.                                               Florencia Reasons, M.D.    RVB/MEDQ  D:  12/07/2001  T:  12/08/2001  Job:  [id number]   cc:   Thomas A. Patty Sermons, M.D.

## 2010-08-03 NOTE — H&P (Signed)
[place]. [facility]  Patient:    Shelby [name]                     MRN: [id number] Adm. Date:  [id number] Disc. Date: [id number] Attending:  Janalyn Rouse                         History and Physical  CHIEF COMPLAINT: Shelby [name] is a 70 year old patient with a history of breast cancer, now at day 46 following a second course of AC chemotherapy.  She presents with fever in the setting of neutropenia.  HISTORY OF PRESENT ILLNESS: Shelby [name] was diagnosed with breast cancer several months ago and underwent a right mastectomy and TRAM flap reconstruction procedure.  She was seen by Dr. Katrinka Blazing in consultation and she is being treated with adjuvant [place] chemotherapy.  She completed her second cycle of AC chemotherapy on December 05, 1999 and she says that she began to feel "weak" yesterday and this has progressed throughout the day today.  She was seen by Dr. Katrinka Blazing earlier today and it was noted that she had a small abrasion at the skin between the first and second digits of the right hand. She was placed on prophylactic Levaquin when her total WBC was noted to be low at approximately 600, according to the patient.  She also reports that her platelet count was low at 54,000.  This evening she developed a headache, diffuse body aches, chills, and a fever of 101 degrees.  She presented to the [facility] for evaluation.  PAST MEDICAL HISTORY:  1. History of migraine headaches.  2. Mitral valve prolapse.  3. Breast cancer as above.  ALLERGIES: PENICILLIN.  CURRENT MEDICATIONS:  1. Coumadin 1 mg q.d.  2. Effexor q.d.  3. Elavil 50 mg q.h.s.  FAMILY HISTORY: Noncontributory.  SOCIAL HISTORY: Noncontributory.  REVIEW OF SYSTEMS: Otherwise unremarkable.  PHYSICAL EXAMINATION:  VITAL SIGNS: Temperature 100.6 degrees, blood pressure 109/57, pulse 98, respirations 22.  Oxygen saturation 98% on room air.  HEENT: Oropharynx  without thrush.  There is a small ulcer over the left buccal mucosa.  NECK: Without mass.  LUNGS: Clear bilaterally.  No distress.  CARDIAC: Regular rate and rhythm.  No gallop.  Port-A-Cath site without erythema or tenderness.  BREAST: Status post right mastectomy with TRAM reconstruction.  No evidence of local recurrence.  ABDOMEN: Soft, nontender.  No organomegaly.  No masses.  SKIN: There is a small abrasion between the first and second digits of the right hand.  There is an area of erythema measuring less than 0.5 cm.  There is no fluctuance.  No discharge.  No streaking up the right arm.  No edema over the right arm or hand.  LABORATORY DATA: CBC and chemistry panel are pending.  IMPRESSION/PLAN: Shelby [name] is now at day 72 following a second course of AC chemotherapy.  She presents tonight with fever, malaise, headache, and with arthralgias/myalgias.  She was noted to have neutropenia when she was seen in the office earlier today.  She will be admitted for broad-spectrum intravenous antibiotic support.  We will obtain cultures of the blood and urine.  She will continue on G-CSF support.  We will observe the right hand closely for evidence of a progressive infection. DD:  12/13/99 TD:  12/14/99 Job: [id number]

## 2010-08-03 NOTE — Op Note (Signed)
[place]. [facility]  Patient:    Shelby [name], Shelby [name]                    MRN: [id number] Proc. Date: 09/24/99 Adm. Date:  [id number] Attending:  Janalyn Rouse                           Operative Report  PREOPERATIVE DIAGNOSIS:  Right anterior chest deformity immediately status post  total mastectomy.  POSTOPERATIVE DIAGNOSIS:  Right anterior chest deformity immediately status post total mastectomy.  PROCEDURE:  Immediate right breast reconstruction with ipsilateral pedicled TRAM flap.  SURGEON:  Janet Berlin. Dan Humphreys, M.D.  ASSISTANT:  Teena Irani. Odis Luster, M.D.  ANESTHESIA:  General anesthesia.  INDICATIONS:  Shelby [name] is a 70 year old woman who has just undergone right total mastectomy and sentinel lymph node biopsy for breast cancer.  Rose Phi. Young, M.D. has completed the mastectomy and sentinel node portion of the procedure. Responsibility is now turned over to me to complete the reconstruction.  The patient was counseled preoperatively in regards to a TRAM flap reconstruction which was carried out as described below.  DESCRIPTION OF PROCEDURE:  Late last week in the office, I placed the surface markings over the TRAM donor site and also outlined the right breast pocket. The patient was taken to the operating room initially by Rose Phi. Maple Hudson, M.D.  She as prepped and draped so as to allow access to both breasts as well as the TRAM flap donor site.  I scrubbed in to the procedure upon Rose Phi. Young, M.D. finishing his portion.  I first made the uppermost of the two transverse abdominal incisions with a #10  blade and deepened this through the subcutaneous tissue with electrocautery. The plane of dissection was carried out along the anterior surface of the fascia up and over the costal margins bilaterally.  The table was temporarily placed in the waist to knee flex position to confirm that I could get an adequate closure  without undue tension.  The table was then turned to the fully supine position.  I then made he lowermost of the two transverse abdominal incisions.  A flap was elevated completely up off the fascia on the left, or contralateral side.  On the right, or ipsilateral side, dissection was carried from lateral to medial until the lateral most row of perforators was identified.  Dissection was also carried from medial to lateral until the medial most row of perforators was identified.  The fascia was divided with a #15 blade in the longitudinal fashion, leaving a strip along the  middle of the rectus abdominis muscle.  The muscle was completely shelled out from its soft tissue attachments leaving the TRAM flap adherent over the area of the  perforators.  A tunnel had previously been developed connecting the mastectomy pocket with the TRAM flap donor site.  At this point, the flap was carefully passed through the tunnel created for it and brought out into the breast pocket.  I temporarily stapled the flap into position while the abdominal closure was carried out.  The abdominal wound was copiously irrigated out with crystalloid solution and hemostasis reconfirmed.  The defect in the fascia was meticulously repaired with interrupted figure-of-eight sutures of 0 Prolene.  The donor site was once again irrigated.  I then took a 6 inch x 6 inch piece of Marlex mesh and cut it to precisely fit over the defect in  the fascia.  This was sutured in circumferentially with a running 2-0 monofillament Prolene suture.  A small opening was made in the mesh through which the umbilical stalk was allowed to come through.  The table as once again placed in a waist and knee flexed position.  I placed a few sutures f interrupted, buried 2-0 Vicryl in the dermis of the midpoint of the abdominal wound.  I then made a short opening over the midline of the anterior abdominal all through which I planned  to bring the umbilical stalk.  Interrupted 3-0 Vicryl sutures were used to inset the stalk into position to the skin of the anterior abdominal wall.  Interrupted 5-0 monofillament Prolene sutures were used for final epidermal leveling of the inset site.  Two blake drains were placed through separate stab incisions inferiorly and anchored into position using interrupted 3-0 monofillament Prolene sutures.  The transverse abdominal incision was then closed with interrupted, buried dermal sutures of 2-0 Vicryl.  Steri-Strips were used o reinforce this wound closure.  Attention was next directed back to the insetting of the TRAM flap in the right  breast pocket. I found that I got the best asthetic result by rotating the flap  approximately 75 degrees clockwise so as to provide for infraclavicular fill. he patient had a fairly narrow breast in transverse dimension which allowed me to urn the flap and still to fill out the pocket appropriately.  The flap was first inset inferiorly with interrupted, buried dermal sutures of 3-0 Vicryl.  I found that I had to deepithelialize a small portion of the flap medially.  I then deepithelialized a large portion of the flap superiorly.  This was suspended from the upper chest wall with interrupted 3-0 Vicryl sutures.  The upper line of inset was carried out with a running subcuticular suture of 3-0 Vicryl.  I then closed the short axillary incision that [employer]. Young, M.D. had made to perform the sentinel node dissection.  This was carried out with interrupted, buried dermal  sutures of 3-0 Vicryl followed by placement of Steri-Strips.  Steri-Strips were  also used to reinforce the wound closure of the reconstructed breast.  A drain which had been previously placed through a separate stab incision inferiorly and laterally in the right breast was at this point hooked to closed grenade suction. All wounds were sterilely dressed with a light  application of surgical gauze and paper tape.  The patient had also requested that I remove a small skin lesion from the left interior thigh.  I prepped the left interior thigh and excised what appeared to be  a small angioma measuring 3 to 4 mm in diameter.  The resulting defect was closed with three simple interrupted sutures of 5-0 nylon.  A sterile Band-aid was placed over this excision site.  The patient was allowed to emerge from general anesthesia and successfully extubated.  She was transported to the postanesthesia care unit on her hospital bed which was placed in the waist and knee flexed position.  She appeared to tolerate the procedure well.  I would estimate the blood loss during my portion of the procx to be less than 100 cc. DD:  09/24/99 TD:  09/24/99 Job: [id number]

## 2010-08-03 NOTE — Op Note (Signed)
[place]. [facility]  Patient:    Shelby [name]                     MRN: [id number] Proc. Date: 10/15/99 Adm. Date:  [id number] Attending:  Janalyn Rouse CC:         Aliene Altes, M.D.                           Operative Report  PREOPERATIVE DIAGNOSIS:  TII carcinoma of the right breast.  POSTOPERATIVE DIAGNOSIS:  TII carcinoma of the right breast.  OPERATION:  Insertion of implantable port.  ANESTHESIA:  MAC.  OPERATIVE PROCEDURE:  The patient placed on the operating table and the left upper chest prepped and draped in the usual fashion.  Under local anesthesia, a left subclavian puncture was carried out with ease and a guidewire inserted and proper positioning confirmed with fluoroscopy.  Under local anesthesia, we made a transverse incision on the anterior chest wall and developed a pocket for the implantable port.  We then tunneled between the subclavian puncture site and the implantable port and then we passed the pre-attached the catheter out through the subclavian area and then anchored the port in the prepared pocket with two 2-0 Prolene sutures.  The catheter tip was then cut at the appropriate level of the fourth interspace and then the dilator and peel-away sheath were passed over the wire and then the wire and then the dilator removed and the catheter passed through the peel-away sheath with ease and then the sheath removed.  Proper position of the catheter tip was also confirmed by fluoroscopy.  The system was fully heparinized and then closed the incision with 3-0 Vicryl and subcuticular 4-0 Monocryl and Steri-Strips.  Dressings applied and the patient transferred to the recovery room in satisfactory condition having tolerated the procedure well. DD:  10/15/99 TD:  10/16/99 Job: [id number]

## 2010-08-03 NOTE — Procedures (Signed)
[place]. [facility]  Patient:    Shelby [name], Shelby [name] Visit Number: [id number] MRN: [id number]          Service Type: END Location: ENDO Attending Physician:  Rich Brave Dictated by:   Florencia Reasons, M.D. Proc. Date: 04/22/01 Admit Date:  04/22/2001   CC:         Dr. Radene Knee Jettie Pagan A. Patty Sermons, M.D.   Procedure Report  PROCEDURE PERFORMED:  Colonoscopy with polypectomy.  ENDOSCOPIST:  Florencia Reasons, M.D.  INDICATIONS FOR PROCEDURE:  The patient is a 70 year old for colon cancer screening.  She has no worrisome symptoms.  She does have a personal  history of breast cancer.  FINDINGS:  An 8 mm rectal polyp removed by snare technique.  DESCRIPTION OF PROCEDURE:  The nature, purpose and risks of the procedure had been discussed with the patient, who provided written consent.  Sedation was fentanyl 60 mcg and Versed 6 mg IV without arrhythmias or desaturation.  The Olympus adjustable video colonoscope was advanced without significant difficulty to the terminal ileum which had a normal appearance for the short distance that it was entered and pull-back was then performed.  The quality of the prep was very good and it is felt that all areas were adequately seen. There was an 8 mm semipedunculated polyp removed by snare technique from the proximal rectum at about 15 cm, with complete hemostasis and no evidence of excessive cautery.  No other polyps were seen and there was no evidence of cancer, colitis, vascular malformations or diverticulosis.  Retroflexion in the rectum was unremarkable.  The patient tolerated the procedure well and there were no apparent complications.  IMPRESSION:  Medium sized rectal polyp, removed.  Otherwise normal exam.  PLAN:  Await pathology on the polyp. Dictated by:   Florencia Reasons, M.D. Attending Physician:  Rich Brave DD:  04/22/01 TD:  04/22/01 Job:  [id number]

## 2010-08-08 ENCOUNTER — Ambulatory Visit
Admission: RE | Admit: 2010-08-08 | Discharge: 2010-08-08 | Disposition: A | Discharge status: Home / Self Care | Payer: Medicare Other | Source: Ambulatory Visit | Attending: Obstetrics and Gynecology | Admitting: Obstetrics and Gynecology

## 2010-08-08 DIAGNOSIS — Z1231 Encounter for screening mammogram for malignant neoplasm of breast: Secondary | ICD-10-CM

## 2010-08-25 ENCOUNTER — Other Ambulatory Visit: Payer: Self-pay | Admitting: Cardiology

## 2010-08-28 ENCOUNTER — Encounter: Payer: Self-pay | Admitting: Cardiology

## 2010-08-28 ENCOUNTER — Other Ambulatory Visit: Payer: Self-pay | Admitting: *Deleted

## 2010-08-28 DIAGNOSIS — E785 Hyperlipidemia, unspecified: Secondary | ICD-10-CM

## 2010-08-29 ENCOUNTER — Ambulatory Visit: Payer: MEDICARE | Admitting: Nurse Practitioner

## 2010-08-29 ENCOUNTER — Ambulatory Visit (INDEPENDENT_AMBULATORY_CARE_PROVIDER_SITE_OTHER): Payer: Medicare Other | Admitting: Cardiology

## 2010-08-29 ENCOUNTER — Encounter: Payer: Self-pay | Admitting: Cardiology

## 2010-08-29 ENCOUNTER — Other Ambulatory Visit (INDEPENDENT_AMBULATORY_CARE_PROVIDER_SITE_OTHER): Payer: Medicare Other | Admitting: *Deleted

## 2010-08-29 DIAGNOSIS — Z79899 Other long term (current) drug therapy: Secondary | ICD-10-CM

## 2010-08-29 DIAGNOSIS — I059 Rheumatic mitral valve disease, unspecified: Secondary | ICD-10-CM

## 2010-08-29 DIAGNOSIS — E785 Hyperlipidemia, unspecified: Secondary | ICD-10-CM

## 2010-08-29 DIAGNOSIS — H8110 Benign paroxysmal vertigo, unspecified ear: Secondary | ICD-10-CM

## 2010-08-29 DIAGNOSIS — IMO0002 Reserved for concepts with insufficient information to code with codable children: Secondary | ICD-10-CM

## 2010-08-29 LAB — LDL CHOLESTEROL, DIRECT: Direct LDL: 256 mg/dL

## 2010-08-29 LAB — CBC WITH DIFFERENTIAL/PLATELET
Basophils Absolute: 0 10*3/uL (ref 0.0–0.1)
Eosinophils Relative: 8.6 % — ABNORMAL HIGH (ref 0.0–5.0)
HCT: 42.7 % (ref 36.0–46.0)
Lymphocytes Relative: 34.6 % (ref 12.0–46.0)
Monocytes Relative: 10.6 % (ref 3.0–12.0)
Platelets: 167 10*3/uL (ref 150.0–400.0)
RDW: 13.1 % (ref 11.5–14.6)
WBC: 5.5 10*3/uL (ref 4.5–10.5)

## 2010-08-29 LAB — LIPID PANEL
Cholesterol: 362 mg/dL — ABNORMAL HIGH (ref 0–200)
Total CHOL/HDL Ratio: 7
Triglycerides: 326 mg/dL — ABNORMAL HIGH (ref 0.0–149.0)
VLDL: 65.2 mg/dL — ABNORMAL HIGH (ref 0.0–40.0)

## 2010-08-29 LAB — HEPATIC FUNCTION PANEL
ALT: 21 U/L (ref 0–35)
AST: 31 U/L (ref 0–37)
Bilirubin, Direct: 0 mg/dL (ref 0.0–0.3)
Total Bilirubin: 0.6 mg/dL (ref 0.3–1.2)

## 2010-08-29 LAB — BASIC METABOLIC PANEL
BUN: 15 mg/dL (ref 6–23)
Chloride: 101 mEq/L (ref 96–112)
Creatinine, Ser: 0.9 mg/dL (ref 0.4–1.2)
GFR: 68.25 mL/min (ref >60.00)
Potassium: 4.8 mEq/L (ref 3.5–5.1)

## 2010-08-29 MED ORDER — TRETINOIN 0.05 % EX CREA: TOPICAL_CREAM | Freq: Every day | CUTANEOUS | Status: DC

## 2010-08-29 NOTE — Assessment & Plan Note (Signed)
Patient has a past history of suspected mitral valve prolapse although her previous echocardiogram in 2005 did not confirm this.  The patient has done well on low dose beta blocker.  She has not been having any recent palpitations or chest pain.

## 2010-08-29 NOTE — Assessment & Plan Note (Signed)
The patient has a long history of hyperlipidemia.  She has been intolerant of most of the medications that we have tried.She is unable to take the statins because of muscle pain.  She is trying to exercise and keep her weight down

## 2010-08-29 NOTE — Progress Notes (Signed)
Shelby [name] Date of Birth:  January 28, 1941 [facility] [address]   [address] [place], [place]  [address] [phone]           Fax   [phone]  HPI: This pleasant 70 year old woman has been feeling well since last visit.  She has not been expressing any chest pain or shortness of breath.  She and her husband are retired and they are splitting her time between [place] and [place].  They alternate weeks each place.  The patient had had some disabling vertigo which was recently cleared up by her ENT physician.  The patient has a history of hypercholesterolemia.  She is intolerant of most cholesterol-lowering medications she said a history of breast cancer of the right breast with reconstruction.  She's had a past history of syncope felt to be secondary to vasovagal factors  Current Outpatient Prescriptions  Medication Sig Dispense Refill  . amitriptyline (ELAVIL) 50 MG tablet Take 50 mg by mouth at bedtime. Taking one to two tablets       . atenolol (TENORMIN) 25 MG tablet Take 25 mg by mouth daily.        . Calcium Carbonate-Vitamin D (CALCIUM 600 + D PO) Take by mouth 2 (two) times daily.       . diazepam (VALIUM) 5 MG tablet Take 5 mg by mouth as needed.        . tretinoin (RETIN-A) 0.05 % cream Apply topically at bedtime. As directed  45 g  11  . DISCONTD: tretinoin (RETIN-A) 0.05 % cream Apply topically at bedtime. As directed       . butalbital-aspirin-caffeine (FIORINAL) 50-325-40 MG per capsule Take 1 capsule by mouth every 4 (four) hours as needed for headache.  30 capsule  5  . meclizine (ANTIVERT) 25 MG tablet Take 1 tablet (25 mg total) by mouth 3 (three) times daily as needed for dizziness.  30 tablet  0    Allergies  Allergen Reactions  . Benzonatate     REACTION: migraines  . Codeine     REACTION: Nausea  . Latex   . Penicillins   . Sulfonamide Derivatives     Patient Active Problem List  Diagnoses  . HYPERLIPIDEMIA  .  MITRAL VALVE PROLAPSE  . ALLERGIC RHINITIS  . ASTHMA, CHILDHOOD  . DYSPNEA  . COUGH  . BREAST CANCER, HX OF  . Positional vertigo    History  Smoking status  . Never Smoker   Smokeless tobacco  . Never Used    History  Alcohol Use No    Family History  Problem Relation Age of Onset  . Arthritis Mother   . Heart disease Father   . Heart attack Father     Review of Systems: The patient denies any heat or cold intolerance.  No weight gain or weight loss.  The patient denies headaches or blurry vision.  There is no cough or sputum production.  The patient denies dizziness.  There is no hematuria or hematochezia.  The patient denies any muscle aches or arthritis.  The patient denies any rash.  The patient denies frequent falling or instability.  There is no history of depression or anxiety.  All other systems were reviewed and are negative.   Physical Exam: Filed Vitals:   08/29/10 0853  BP: 120/80  Pulse: 80   The general appearance reveals a well-developed well-nourished woman in no distress.The head and neck exam reveals pupils equal and reactive.  Extraocular movements are full.  There is no scleral icterus.  The mouth and pharynx are normal.  The neck is supple.  The carotids reveal no bruits.  The jugular venous pressure is normal.  The  thyroid is not enlarged.  There is no lymphadenopathy.  The chest is clear to percussion and auscultation.  There are no rales or rhonchi.  Expansion of the chest is symmetrical.  The precordium is quiet.  The first heart sound is normal.  The second heart sound is physiologically split.  There is no murmur.There is a faint apical click.  There is no abnormal lift or heave.  The abdomen is soft and nontender.  The bowel sounds are normal.  The liver and spleen are not enlarged.  There are no abdominal masses.  There are no abdominal bruits.  Extremities reveal good pedal pulses.  There is no phlebitis or edema.  There is no cyanosis or clubbing.   Strength is normal and symmetrical in all extremities.  There is no lateralizing weakness.  There are no sensory deficits.  The skin is warm and dry.  There is no rash.   Assessment / Plan: Continue present medication.  Recheck in 4 months for followup office visit and lab work.  Blood work drawn today is pending.  She prefers to have her blood work drawn Prior to her visit.

## 2010-08-29 NOTE — Assessment & Plan Note (Signed)
The patient had had a recent bout of severe vertigo.  She did see her ENT physician Dr. Narda Bonds who diagnosed and removed a large plug of wax from her right ear following which she was able to hear better and her vertigo improved

## 2010-08-30 ENCOUNTER — Telehealth: Payer: Self-pay | Admitting: *Deleted

## 2010-08-30 NOTE — Telephone Encounter (Signed)
Advised patient of lab results. Patient not able to take Zetia but will try Lovaza,samples given. Will check with Dr. Leonard Schwartz on dose

## 2010-08-30 NOTE — Telephone Encounter (Signed)
Start with 1 tab BID and after aweek try increasing to 2 BID

## 2010-08-30 NOTE — Progress Notes (Signed)
Lm for patient to call

## 2010-10-10 ENCOUNTER — Ambulatory Visit
Admission: RE | Admit: 2010-10-10 | Discharge: 2010-10-10 | Disposition: A | Discharge status: Home / Self Care | Payer: Medicare Other | Source: Ambulatory Visit | Attending: Gastroenterology | Admitting: Gastroenterology

## 2010-10-10 ENCOUNTER — Other Ambulatory Visit: Payer: Self-pay | Admitting: Gastroenterology

## 2010-10-10 DIAGNOSIS — Z86010 Personal history of colonic polyps: Secondary | ICD-10-CM

## 2010-10-15 ENCOUNTER — Encounter (INDEPENDENT_AMBULATORY_CARE_PROVIDER_SITE_OTHER): Payer: Self-pay | Admitting: General Surgery

## 2010-10-18 ENCOUNTER — Encounter (INDEPENDENT_AMBULATORY_CARE_PROVIDER_SITE_OTHER): Payer: Self-pay | Admitting: General Surgery

## 2010-10-18 ENCOUNTER — Ambulatory Visit (INDEPENDENT_AMBULATORY_CARE_PROVIDER_SITE_OTHER): Payer: Medicare Other | Admitting: General Surgery

## 2010-10-18 ENCOUNTER — Other Ambulatory Visit (INDEPENDENT_AMBULATORY_CARE_PROVIDER_SITE_OTHER): Payer: Self-pay | Admitting: General Surgery

## 2010-10-18 ENCOUNTER — Ambulatory Visit
Admission: RE | Admit: 2010-10-18 | Discharge: 2010-10-18 | Disposition: A | Discharge status: Home / Self Care | Payer: Medicare Other | Source: Ambulatory Visit | Attending: General Surgery | Admitting: General Surgery

## 2010-10-18 VITALS — BP 140/80 | HR 60 | Temp 98.2°F | Resp 18 | Ht 64.0 in | Wt 135.0 lb

## 2010-10-18 DIAGNOSIS — C182 Malignant neoplasm of ascending colon: Secondary | ICD-10-CM

## 2010-10-18 MED ORDER — IOHEXOL 300 MG/ML  SOLN
100.0000 mL | Freq: Once | INTRAMUSCULAR | Status: AC | PRN
  Administered 2010-10-18: 100 mL via INTRAVENOUS

## 2010-10-18 NOTE — Progress Notes (Signed)
Subjective:     Patient ID: Shelby [name], female   DOB: 1940/04/05, 70 y.o.   MRN: [id number]  HPI This is a healthy 70 year old Caucasian female, sent to me for the courtesy of Dr. 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 Dr. Cassell Clement is her primary care physician. Dr. Edyth Gunnels is her gynecologist.  The patient has almost no symptoms. She had some vague right-sided abdominal pain several months ago but that has now resolved. She had an ultrasound with Dr. Eda Paschal and he found a left-sided ovarian cyst which apparently is small. Otherwise no GI symptoms.  She had a screening colonoscopy since she has had colon polyps in the past. Dr. Matthias Hughs found an ulcerated, nonobstructing small mass in the distal ascending colon. This was non-circumferential. The diameter measured 25 mm. Biopsies were taken, a clip was placed, and [place] ink was injected. A pathology report shows adenocarcinoma. She is asymptomatic today and here with her husband.  Significant issues in her past history and family history are that she has had a mastectomy and a TRAM flap with mesh in the year 2000. She had an open appendectomy through a lower midline incision in 1965. She had a vaginal hysterectomy. It is also notable that her son died of a neuroendocrine cancer of the rectum at age 6, and a niece that of colon cancer metastatic to the liver at age 67. She has not had any genetic counseling and testing.  Past Medical History  Diagnosis Date  . MVP (mitral valve prolapse)   . Migraine   . Breast cancer 2001    s/p surgery and chemo   . Eosinophilia   . Syncope     secondary to vasovagal phenomenon  . Hypercholesterolemia     statin intolerant  . Ovarian cyst   . Heart murmur     mitral valve prolapse   Current Outpatient Prescriptions  Medication Sig Dispense Refill  . amitriptyline (ELAVIL) 50 MG tablet Take 50 mg by mouth  at bedtime. Taking one to two tablets       . atenolol (TENORMIN) 25 MG tablet Take 25 mg by mouth daily.        . Calcium Carbonate-Vitamin D (CALCIUM 600 + D PO) Take by mouth 2 (two) times daily.       . diazepam (VALIUM) 5 MG tablet Take 5 mg by mouth as needed.        . tretinoin (RETIN-A) 0.05 % cream Apply topically at bedtime. As directed  45 g  11  . butalbital-aspirin-caffeine (FIORINAL) 50-325-40 MG per capsule Take 1 capsule by mouth every 4 (four) hours as needed for headache.  30 capsule  5  . meclizine (ANTIVERT) 25 MG tablet Take 1 tablet (25 mg total) by mouth 3 (three) times daily as needed for dizziness.  30 tablet  0   Allergies  Allergen Reactions  . Latex Rash  . Benzonatate     REACTION: migraines  . Codeine     REACTION: Nausea  . Penicillins Rash  . Sulfonamide Derivatives Rash   Family History  Problem Relation Age of Onset  . Arthritis Mother   . Heart disease Father   . Heart attack Father   . Cancer Son     History  Substance Use Topics  . Smoking status: Never Smoker   . Smokeless tobacco: Never Used  . Alcohol Use: 0.6 oz/week    1 Glasses  of wine per week      Review of Systems  Constitutional: Negative.   HENT: Negative.   Eyes: Negative.   Respiratory: Negative.   Cardiovascular: Negative.        Mitral valve prolapse  Gastrointestinal: Negative.   Genitourinary: Negative.   Musculoskeletal: Negative.   Neurological: Negative.   Hematological: Negative.   Psychiatric/Behavioral: Negative.        Objective:   Physical Exam  Constitutional: She is oriented to person, place, and time. She appears well-developed and well-nourished. No distress.  HENT:  Head: Normocephalic and atraumatic.  Mouth/Throat: No oropharyngeal exudate.  Eyes: Conjunctivae are normal. No scleral icterus.  Neck: Neck supple. No JVD present. No tracheal deviation present. No thyromegaly present.  Cardiovascular: Normal rate, regular rhythm and normal  heart sounds.   No murmur heard. Pulmonary/Chest: Effort normal and breath sounds normal. No respiratory distress. She has no wheezes. She has no rales. She exhibits no tenderness.  Abdominal: Soft. Bowel sounds are normal. She exhibits no distension and no mass. There is no tenderness. There is no rebound and no guarding.    Musculoskeletal: Normal range of motion. She exhibits no edema and no tenderness.  Lymphadenopathy:    She has no cervical adenopathy.  Neurological: She is alert and oriented to person, place, and time. She exhibits normal muscle tone. Coordination normal.  Skin: Skin is warm and dry. No rash noted. She is not diaphoretic. No erythema. No pallor.  Psychiatric: She has a normal mood and affect. Her behavior is normal. Judgment and thought content normal.       Assessment:     Adenocarcinoma of the distal ascending colon.  History of breast cancer with TRAM flap reconstruction.  Mitral valve prolapse.  History of back surgery.  History of open appendectomy through a lower midline incision.  History of vaginal hysterectomy.  Migraine headaches    Plan:     Will schedule for laparoscopic-assisted right colectomy, possible open. The TRAM flap and mesh reconstruction may or may not interfere with the laparoscopic approach.  I have discussed her case with Dr. Eda Paschal. He will plan bilateral oophorectomy at the time of surgery. He will go first once I had created access.  Bowel prep orders are written.  I have discussed the indications and details of surgery with the patient and her husband. Risks and complications have been outlined, including but not limited to bleeding, infection, conversion to open laparotomy, and injury to adjacent organs such as the intestine ureter bladder major vascular structures with major reconstruction surgery, wound healing problems such as hernia or infection, cardiac pulmonary and thromboembolic problems. She does understand  these issues well. At this time all of her questions were answered. She is infiltrated with this plan.

## 2010-10-18 NOTE — Patient Instructions (Signed)
You will be scheduled for a CT scan of the abdomen and pelvis preop. We will schedule you for surgery, which will be a laparoscopic assisted right colectomy.

## 2010-10-22 ENCOUNTER — Telehealth (INDEPENDENT_AMBULATORY_CARE_PROVIDER_SITE_OTHER): Payer: Self-pay

## 2010-10-22 ENCOUNTER — Telehealth: Payer: Self-pay | Admitting: Obstetrics and Gynecology

## 2010-10-22 NOTE — Telephone Encounter (Signed)
I am fine to do her at [name].

## 2010-10-22 NOTE — Telephone Encounter (Signed)
Patient called concerned about her surgery date that's scheduled in September, she would like to be scheduled as soon as possible, patient feel's the date is too far out and would you to give her a call.

## 2010-10-22 NOTE — Telephone Encounter (Signed)
I received note from [facility] [id number] surgery. I have forwarded this to  [facility]. She is working on this case coord with her gyn.

## 2010-10-23 ENCOUNTER — Ambulatory Visit (INDEPENDENT_AMBULATORY_CARE_PROVIDER_SITE_OTHER): Payer: Self-pay | Admitting: Surgery

## 2010-10-24 ENCOUNTER — Telehealth: Payer: Self-pay

## 2010-10-24 NOTE — Telephone Encounter (Signed)
Pt. Called to ask if she needs to keep the appt she has on 8/22 for gyn u/s and visit with Dr. Reece Agar since she is now scheduled for surgery on the 29th of August.  I told her Dr. Reece Agar. Did want her to keep that appointment to recheck ovaries before surgery and also it will serve as her preop consult as well.  Pt will keep appt 11/07/10.

## 2010-10-30 ENCOUNTER — Other Ambulatory Visit: Payer: Self-pay | Admitting: *Deleted

## 2010-10-30 MED ORDER — AMITRIPTYLINE HCL 50 MG PO TABS: ORAL_TABLET | ORAL | Status: DC

## 2010-10-30 NOTE — Telephone Encounter (Signed)
Refilled meds per fax request.  

## 2010-11-05 ENCOUNTER — Telehealth: Payer: Self-pay | Admitting: Cardiology

## 2010-11-05 NOTE — Telephone Encounter (Signed)
Faxed Last OV Note, last ECHO, last EKG, and last Stress available to [phone] today

## 2010-11-05 NOTE — Telephone Encounter (Signed)
Preadmit needs last OV Note, EKG, Echo, Stress w/resting, Cardiac Cath, if at all available needs faxed by this Wed 8/22 for her PreOp

## 2010-11-07 ENCOUNTER — Other Ambulatory Visit (INDEPENDENT_AMBULATORY_CARE_PROVIDER_SITE_OTHER): Payer: Self-pay | Admitting: General Surgery

## 2010-11-07 ENCOUNTER — Other Ambulatory Visit: Payer: Medicare Other

## 2010-11-07 ENCOUNTER — Ambulatory Visit (INDEPENDENT_AMBULATORY_CARE_PROVIDER_SITE_OTHER): Payer: Medicare Other | Admitting: Obstetrics and Gynecology

## 2010-11-07 ENCOUNTER — Ambulatory Visit (HOSPITAL_COMMUNITY)
Admission: RE | Admit: 2010-11-07 | Discharge: 2010-11-07 | Disposition: A | Discharge status: Home / Self Care | Payer: Medicare Other | Source: Ambulatory Visit | Attending: General Surgery | Admitting: General Surgery

## 2010-11-07 ENCOUNTER — Ambulatory Visit: Payer: Self-pay

## 2010-11-07 ENCOUNTER — Encounter (HOSPITAL_COMMUNITY): Payer: Medicare Other

## 2010-11-07 DIAGNOSIS — N83339 Acquired atrophy of ovary and fallopian tube, unspecified side: Secondary | ICD-10-CM

## 2010-11-07 DIAGNOSIS — Z01811 Encounter for preprocedural respiratory examination: Secondary | ICD-10-CM

## 2010-11-07 DIAGNOSIS — C189 Malignant neoplasm of colon, unspecified: Secondary | ICD-10-CM | POA: Insufficient documentation

## 2010-11-07 DIAGNOSIS — Z01812 Encounter for preprocedural laboratory examination: Secondary | ICD-10-CM | POA: Insufficient documentation

## 2010-11-07 DIAGNOSIS — D3910 Neoplasm of uncertain behavior of unspecified ovary: Secondary | ICD-10-CM

## 2010-11-07 DIAGNOSIS — N83209 Unspecified ovarian cyst, unspecified side: Secondary | ICD-10-CM

## 2010-11-07 DIAGNOSIS — Z01818 Encounter for other preprocedural examination: Secondary | ICD-10-CM | POA: Insufficient documentation

## 2010-11-07 LAB — CBC
HCT: 42.6 % (ref 36.0–46.0)
MCV: 87.3 fL (ref 78.0–100.0)
RBC: 4.88 MIL/uL (ref 3.87–5.11)
WBC: 8.9 10*3/uL (ref 4.0–10.5)

## 2010-11-07 LAB — DIFFERENTIAL
Eosinophils Relative: 5 % (ref 0–5)
Lymphocytes Relative: 30 % (ref 12–46)
Lymphs Abs: 2.7 10*3/uL (ref 0.7–4.0)
Monocytes Absolute: 0.7 10*3/uL (ref 0.1–1.0)
Neutro Abs: 5 10*3/uL (ref 1.7–7.7)

## 2010-11-07 LAB — COMPREHENSIVE METABOLIC PANEL
ALT: 24 U/L (ref 0–35)
AST: 32 U/L (ref 0–37)
Albumin: 4 g/dL (ref 3.5–5.2)
CO2: 31 mEq/L (ref 19–32)
Calcium: 10.5 mg/dL (ref 8.4–10.5)
GFR calc non Af Amer: 59 mL/min — ABNORMAL LOW (ref >60)
Sodium: 139 mEq/L (ref 135–145)
Total Protein: 7.8 g/dL (ref 6.0–8.3)

## 2010-11-07 LAB — URINALYSIS, ROUTINE W REFLEX MICROSCOPIC
Bilirubin Urine: NEGATIVE
Glucose, UA: NEGATIVE mg/dL
Ketones, ur: NEGATIVE mg/dL
Specific Gravity, Urine: 1.014 (ref 1.005–1.030)
pH: 7 (ref 5.0–8.0)

## 2010-11-07 LAB — CEA: CEA: 1.2 ng/mL (ref 0.0–5.0)

## 2010-11-07 LAB — URINE MICROSCOPIC-ADD ON

## 2010-11-07 LAB — SURGICAL PCR SCREEN: Staphylococcus aureus: INVALID — AB

## 2010-11-07 NOTE — Progress Notes (Signed)
The patient came back today for follow up ultrasound Her uterus is surgically absent,  her right ovary remains atrophic. She continues to have a cyst on her left ovary which is thin walled. It is echo-free and is now 2.6 cm. The septum that was previously there is now gone.the cyst is negative for PFD. The cul-de-sac is free of fluid.  The patient and I had a long discussion about this persistent cyst. She is scheduled for colon cancer surgery. I previously discussed with Dr. Oneida Arenas removing her ovaries at the same time. I believe this is appropriate due to her 2 previous malignancies. She understands that we will move both her ovaries. We discussed risks including injury to bowel bladder or ureter. She may require a laparotomy for the: Surgery. I told her we would try our best to remove both ovaries but I don't want to delay her surgery unnecessarily long because of the nature of her colon cancer surgery. She understands all the above. Total time of interview was 30 minutes.

## 2010-11-10 LAB — MRSA CULTURE

## 2010-11-12 ENCOUNTER — Telehealth (INDEPENDENT_AMBULATORY_CARE_PROVIDER_SITE_OTHER): Payer: Self-pay

## 2010-11-12 NOTE — Telephone Encounter (Signed)
Pt called re: colon prep. Reviewed prep instructions.

## 2010-11-14 ENCOUNTER — Other Ambulatory Visit (INDEPENDENT_AMBULATORY_CARE_PROVIDER_SITE_OTHER): Payer: Self-pay | Admitting: General Surgery

## 2010-11-14 ENCOUNTER — Inpatient Hospital Stay (HOSPITAL_COMMUNITY)
Admission: RE | Admit: 2010-11-14 | Discharge: 2010-11-17 | DRG: 331 | Disposition: A | Discharge status: Home / Self Care | Payer: Medicare Other | Source: Ambulatory Visit | Attending: General Surgery | Admitting: General Surgery

## 2010-11-14 DIAGNOSIS — N83209 Unspecified ovarian cyst, unspecified side: Secondary | ICD-10-CM | POA: Diagnosis present

## 2010-11-14 DIAGNOSIS — Z01812 Encounter for preprocedural laboratory examination: Secondary | ICD-10-CM

## 2010-11-14 DIAGNOSIS — Z853 Personal history of malignant neoplasm of breast: Secondary | ICD-10-CM

## 2010-11-14 DIAGNOSIS — C189 Malignant neoplasm of colon, unspecified: Secondary | ICD-10-CM

## 2010-11-14 DIAGNOSIS — C182 Malignant neoplasm of ascending colon: Principal | ICD-10-CM | POA: Diagnosis present

## 2010-11-14 DIAGNOSIS — R21 Rash and other nonspecific skin eruption: Secondary | ICD-10-CM | POA: Diagnosis not present

## 2010-11-14 HISTORY — PX: LAPAROSCOPIC SALPINGOOPHERECTOMY: SUR795

## 2010-11-14 LAB — ABO/RH: ABO/RH(D): O POS

## 2010-11-14 LAB — TYPE AND SCREEN: ABO/RH(D): O POS

## 2010-11-15 LAB — BASIC METABOLIC PANEL
BUN: 7 mg/dL (ref 6–23)
CO2: 25 mEq/L (ref 19–32)
Glucose, Bld: 192 mg/dL — ABNORMAL HIGH (ref 70–99)
Potassium: 5 mEq/L (ref 3.5–5.1)
Sodium: 131 mEq/L — ABNORMAL LOW (ref 135–145)

## 2010-11-15 LAB — CBC
Hemoglobin: 12.5 g/dL (ref 12.0–15.0)
MCH: 29.8 pg (ref 26.0–34.0)
MCHC: 33.9 g/dL (ref 30.0–36.0)
MCV: 88.1 fL (ref 78.0–100.0)
RBC: 4.19 MIL/uL (ref 3.87–5.11)

## 2010-11-18 NOTE — Op Note (Signed)
[name], [name] NO.:  [id number]  MEDICAL RECORD NO.:  [id number]  LOCATION:  1530                         FACILITY:  [facility]  PHYSICIAN:  Shelby [name], M.D.DATE OF BIRTH:  05-21-1940  DATE OF PROCEDURE:  11/14/2010 DATE OF DISCHARGE:                              OPERATIVE REPORT   PREOPERATIVE DIAGNOSES: 1. Adenocarcinoma of the ascending colon. 2. Left ovarian cyst.  POSTOPERATIVE DIAGNOSIS: 1. Adenocarcinoma of the ascending colon. 2. Left ovarian cyst.  OPERATION PERFORMED: 1. Laparoscopic-assisted right colectomy (Dr. Derrell [name] and Dr. Jamey [name]). 2. Laparoscopic bilateral salpingo-oophorectomy (Dr. Eda [name]).  SURGEON:  Shelby Mould. [name], MD  FIRST ASSISTANT:  Shelby Paris, MD  GYNECOLOGIC CONSULTANT:  Shelby Brunt. Gottsegen, MD  OPERATIVE INDICATIONS:  This is a 70 year old Caucasian female who has a history of a mastectomy and TRAM flap reconstruction with abdominal mesh in the year 2000.  She has had an open appendectomy through a lower midline incision.  She has had a vaginal hysterectomy.  She has some vague abdominal pain.  She had recent ultrasound which showed a left- sided ovarian cyst.  She had a screening colonoscopy which showed colon polyps in the past, but a recent colonoscopy showed an ulcerated, nonobstructing small mass in the ascending colon which was biopsied and [place] ink tattoo was placed.  The pathology reported that shows adenocarcinoma.  CT scan shows no evidence of any metastatic disease. CEA level is normal.  She has been counseled as an outpatient.  I advised her to undergo a right colectomy.  Dr. Eda [name] has asked that he can currently take out both tubes and ovaries.  She was brought to the operating room electively after having a bowel prep at home.  OPERATIVE FINDINGS:  In the mid ascending colon, there was obvious [place] ink tattoo.  After I removed the colon specimen, I could palpate a small tumor,  probably 2.5 cm or less in size.  There was no evidence that this had actually invaded through the wall of the colon.  There was no gross evidence of adenopathy.  There was no gross evidence of metastatic disease to the liver.  There was no ascites.  The left ovary showed a large, simple-appearing ovarian cyst.  The right ovary and tube looked normal.  The appendix appeared to be surgically absent.  The liver appeared healthy without any signs of disease.  OPERATIVE TECHNIQUE:  Following the induction of general endotracheal anesthesia, a Foley catheter was placed.  The patient's abdomen and perineum were prepped and draped in a lithotomy position.  Intravenous antibiotics were given.  The patient was identified as correct patient, correct procedure by both Dr. Eda [name] and myself.  A 0.5% Marcaine with epinephrine was used as local infiltration anesthetic.  A 5 mm optical trocar was inserted in the left subcostal region.  The trocar entry was uneventful.  Pneumoperitoneum was created. Video camera was inserted.  There were almost no adhesions seen.  The patient was placed in Trendelenburg position and a 10 mm trocar was placed in the midline above the umbilicus and two 5 mm trocars placed one in the left lower quadrant and one in the right lower quadrant.  I ultimately  placed a 5 mm trocar in the upper midline.  The patient was placed in Trendelenburg.  After exploring the abdomen and pelvis, Dr. Eda [name] performed laparoscopic bilateral salpingo-oophorectomy removing the specimens in a EndoCatch bag.  There was no evidence of any bleeding from his surgical sites.  At this point, he left the operating room.  We re-positioned the patient.  We examined the terminal ileum, identifying the ileocecal valve, the right colon, the ascending colon, the tattooed area in the ascending colon, the hepatic flexure, the liver, gallbladder, stomach, pylorus, and duodenum.  We mobilized the entire  right colon by dividing its lateral peritoneal attachments.  This was mobilized extensively from lateral to medial.  We took down the hepatic flexure.  The Harmonic scalpel was used for all of this dissection.  Once we felt that we had extensive mobilization, we identified the duodenum which was uninjured.  At this point, we released the pneumoperitoneum.  We made a vertical incision above the umbilicus between the 2 trocars, opening this up about 8 cm in length .  We incised the fascia at the midline.  We did encounter some mesh in that location.  Self-retaining wound protector was placed.  The colon was easily delivered through the wound and we could identify the terminal ileum, right colon, hepatic flexure, and right transverse colon.  We could palpate the small tumor in the mid ascending colon.  We transected the right transverse colon to the right middle colic vessels using a GIA stapling device.  We transected the terminal ileum about 3 inches proximal to ileocecal valve with a GIA stapling device.  Mesenteric vessels were isolated, clamped, divided, and ligated with 2-0 silk ties. The specimen was sent to the lab.  Dr. Jimmy [name] opened it up and took a look and said that the tumor was about 2 cm in size and that there was widely negative margins proximally and distally.  We created an anastomosis between the terminal ileum and the right transverse colon with a GIA stapling device and a TA-60 stapling device. The tip of the ileum looked dusky and so we decided to resect that anastomosis which we did.  We did this by further cleaning off the mesentery of the colon and the terminal ileum and transected the terminal ileum and the transverse colon with a GIA stapling device and removing the resected anastomosis.  At this point in time, we had good healthy tissue both the ileum and the colon and they looked good.  We set up the anastomosis once again and performed the anastomosis with  a GIA stapling device.  The defect in the bowel wall was closed with TA-60 stapling device on a blue load.  We placed a few extra stitches of 3-0 silk to reinforce the staple line at critical points.  We then changed our instruments and gloves.  We closed the mesentery with interrupted sutures of 2-0 silk.  The colon and terminal ileum again looked pink and healthy.  The anastomosis looked strong.  There was no sign of any leak or weakness.  We then dropped the colon back into the abdominal cavity. We irrigated it out with about 2 L.  We closed the midline fascia with interrupted sutures of #1 Novafil and I closed the skin with skin staples.  We re-insufflated the abdomen and inserted the laparoscope. We removed all of the irrigation fluid that was in the pelvis and above the liver.  We checked for bleeding and did not see any.  There was a small area of bleeding on the lower lateral aspect of the right hemidiaphragm and we cauterized that with the cautery and then there was no bleeding.  We irrigated out just a little bit more and everything looked fine. Pneumoperitoneum was released.  The trocars were removed.  All the skin incisions were closed with skin staples.  Clean bandages were placed and the patient taken to recovery room in stable condition.  Estimated blood loss was about 100 cc.  Complications none.  Sponge, needle, and instrument counts were correct.     Shelby [name], M.D.     HMI/MEDQ  D:  11/14/2010  T:  11/15/2010  Job:  [id number]  cc:   Bernette Redbird, M.D. Fax: [phone]  Cassell Clement, M.D. Fax: [phone]  Shelby Brunt. Shelby [name], M.D. Fax: [phone]  Electronically Signed by Claud Kelp M.D. on 11/18/2010 11:44:43 AM

## 2010-11-20 ENCOUNTER — Encounter: Payer: Self-pay | Admitting: Gynecology

## 2010-11-20 ENCOUNTER — Telehealth (INDEPENDENT_AMBULATORY_CARE_PROVIDER_SITE_OTHER): Payer: Self-pay

## 2010-11-20 DIAGNOSIS — C189 Malignant neoplasm of colon, unspecified: Secondary | ICD-10-CM

## 2010-11-20 NOTE — Telephone Encounter (Signed)
Pt home doing well. Per Dr Jacinto Halim request pt advised of path result and advised we have made referral request to [facility].

## 2010-11-20 NOTE — Progress Notes (Signed)
  PER DR. Timoteo Expose NOTE UNDER STAFF MESSAGE PT. NOTIFIED HER OVARIAN CYST WAS BENIGN AND TO MAKE A POST-OP APPT. WITH HIM IN 4 WEEKS. I TRANSFERRED HER TO APPTS.

## 2010-11-22 ENCOUNTER — Ambulatory Visit (INDEPENDENT_AMBULATORY_CARE_PROVIDER_SITE_OTHER): Payer: Medicare Other | Admitting: General Surgery

## 2010-11-22 ENCOUNTER — Encounter (INDEPENDENT_AMBULATORY_CARE_PROVIDER_SITE_OTHER): Payer: Self-pay | Admitting: General Surgery

## 2010-11-22 ENCOUNTER — Encounter (INDEPENDENT_AMBULATORY_CARE_PROVIDER_SITE_OTHER): Payer: Medicare Other | Admitting: General Surgery

## 2010-11-22 VITALS — BP 112/76 | HR 60

## 2010-11-22 DIAGNOSIS — C182 Malignant neoplasm of ascending colon: Secondary | ICD-10-CM

## 2010-11-22 NOTE — Progress Notes (Signed)
Subjective:     Patient ID: Shelby [name], female   DOB: [birth year], 70 y.o.   MRN: [id number]  HPI This patient is 70 years old, and she underwent a laparoscopic-assisted right colectomy on November 14, 2010. She did very well and was discharged on postop day #3. Her final pathology report showed an adenocarcinoma of the right colon, pathologic stage T2,N0. 30 lymph nodes were evaluated and all were negative. Dr. Eda Paschal and also removed both ovaries and tubes and they are benign.  She has done well. She is back to a normal appetite and normal diet. She's having one or 2 soft bowel movements per day. She is not having much pain. She is having no wound problems. No fevers. There have been no problems with her TRAM flap either.  Review of Systems     Objective:   Physical Exam The patient looks very well. She is in good spirits. Her husband is with her.  Abdomen soft, flat and minimally tender. No problems with wound healing. No signs of infection. TRAM  flap looks good. Staples are removed and Steri-Strips are applied.    Assessment:     Adenocarcinoma of the ascending  colon, pathologic stage T2, N0. Recovering uneventfully following laparoscopic right colectomy.    Plan:     I discussed her pathology report with her and her husband.  She will be referred for medical oncology consultation with Dr. Truett Perna  Diet and activities were discussed. Encouraged hydration. Encourage walking. No sports or heavy lifting for 4 weeks.  Return to see me in 4-5 weeks.

## 2010-11-22 NOTE — Patient Instructions (Signed)
You are doing very well following your recent right colon resection. We discussed your athology report today. He had been referred for a medical oncology consultation. Please adhere to a high fiber, low fat diet, and tried to stay hydrated. Please take a walk every day. No heavy lifting for 4 more weeks. Return to see me in 5 weeks.

## 2010-11-23 NOTE — Op Note (Signed)
[name], [name] NO.:  [id number]  MEDICAL RECORD NO.:  [id number]  LOCATION:  1530                         FACILITY:  [facility]  PHYSICIAN:  Deamber Buckhalter L. Lenoir Facchini, M.D.DATE OF BIRTH:  [birth year]  DATE OF PROCEDURE:  11/14/2010 DATE OF DISCHARGE:                              OPERATIVE REPORT   PREOPERATIVE DIAGNOSIS:  Persistent left ovarian cyst.  POSTOPERATIVE DIAGNOSIS:  Persistent left ovarian cyst.  NAME OF OPERATION:  Diagnostic laparoscopy with bilateral salpingo- oophorectomy.  SURGEON:  Harbour Nordmeyer L. Eda Paschal, M.D.  FIRST ASSISTANT:  Angelia Mould. [name], M.D.  FINDINGS:  At time of surgery, patient had a persistent 3 cm cyst of her left ovary.  There were no excrescences on the outside of it.  There was a slight adhesion of the ovary on the left to the broad ligament.  The fallopian tube looked normal.  The pelvic peritoneum appeared normal. The right ovary and tube were normal, postmenopausal ovary and fallopian tube.  PROCEDURE:  After adequate general endotracheal anesthesia, the patient was placed in a dorsal lithotomy position, prepped and draped in the usual sterile manner.  A Foley catheter was inserted into her bladder. A sponge stick was placed in the vagina.  Entrance into the peritoneal cavity was done by Dr. [name] and as described under his note.  Once this had been done, a 10 mm trocar was placed supraumbilically to place the camera and two 5 mm ports were placed in the right and left lower quadrant for ease of doing the ovarian surgery.  Peritoneal washings were first obtained and sent to pathology for tissue diagnosis.  After this, using a grasper, the right ovary and tube were elevated.  The ureter was identified and using an EnSeal, all attachments of the ovary and tube to the broad ligament were cauterized and cut without bleeding. The infundibular pelvic ligament was the first area cauterized and cut. The ovary and tube were now  free.  Attention was then turned to the left side.  Once again, it was elevated.  Once again, the ureter was identified, the cyst was intact.  Once again, using the EnSeal, all attachments including the ovarian artery and vein were cauterized, so that the left ovary and tube were completely free.  The 10 mm laparoscope was then removed.  A 5 mm laparoscope was placed in the right lower quadrant.  An Endopouch was placed through the supraumbilical incision.  Both specimens were removed, intact.To get the ovarian cyst specimen out through the incision, some ovarian cyst fluid had to be drained from it, but this was done in such a fashion that none leaked into the peritoneal cavity.  The specimens were then separated, they consisted of left ovary and tube with cyst wall, right ovary and tube, ovarian cyst fluid, and peritoneal washings.  At the termination of procedure, the patient was still draining clear urine from her Foley catheter.  Following this, colon cancer surgery was going to be performed and dictated by Dr. [name].  Reuel Boom L. Eda Paschal, M.D.     Tonette Bihari  D:  11/14/2010  T:  11/15/2010  Job:  [id number]  Electronically Signed by Edyth Gunnels M.D. on 11/23/2010 07:30:36 AM

## 2010-11-28 NOTE — Discharge Summary (Signed)
Shelby [name], REHM NO.:  [id number]  MEDICAL RECORD NO.:  [id number]  LOCATION:  1530                         FACILITY:  [facility]  PHYSICIAN:  Angelia Mould. Shelby [name], M.D.DATE OF BIRTH:  [birth year]  DATE OF ADMISSION:  11/14/2010 DATE OF DISCHARGE:  11/17/2010                              DISCHARGE SUMMARY   FINAL DIAGNOSES: 1. Adenocarcinoma of the ascending colon, pathologic stage T2 N0. 2. Left ovarian cyst, benign serous cystadenoma. 3. History of breast cancer with transverse rectus abdominis     myocutaneous flap reconstruction. 4. Mitral valve prolapse. 5. History of open appendectomy through lower midline incision. 6. History of vaginal hysterectomy. 7. History of back surgery. 8. History of migraine headaches.  OPERATIONS PERFORMED: 1. Laparoscopic bilateral salpingo-oophorectomy (Dr. Eda Paschal). 2. Laparoscopic-assisted right colectomy (Dr. Derrell [name]).  HISTORY:  This is a 70 year old Caucasian female with a history of mastectomy and TRAM flap reconstruction with abdominal mesh 12 years ago.  She also had an open appendectomy through a lower midline incision as well as a vaginal hysterectomy.  She has had some vague abdominal pain.  Recent ultrasound showed a left ovarian cyst.  Screening colonoscopy showed colon polyps in the past, but recent colonoscopy showed an ulcerated nonobstructing mass in the ascending colon, which was biopsied and tattooed.  The pathology report showed adenocarcinoma. CT scan showed no evidence of any metastatic disease.  CEA level was normal.  She was counseled as an outpatient and plans were made to proceed with a laparoscopic-assisted right colectomy.  Dr. Edyth Gunnels has evaluated her and has advised that she undergo a bilateral salpingo-oophorectomy at the same time.  She was brought to the hospital in the operating room after a bowel prep at home.  HOSPITAL COURSE:  On day of admission, the patient underwent  a laparoscopic-assisted right colectomy and laparoscopic bilateral salpingo-oophorectomy.  The surgery was uneventful.  Final pathology report of the ovaries and the peritoneal washings were benign showing a left serous cystadenoma, which was benign and normal right ovary and tube as well.  The pathology report of the colon showed an invasive adenocarcinoma with pathologic stage T2, N0.  Thirty lymph nodes were negative.  Resection margins were clear.  There was no evidence of lymphovascular invasion.  Postoperatively, the patient did well.  She progressed in her diet and activities and was ready for discharge on postoperative day #3.  At that time, her wound looked good.  She felt well and she wanted to go home. She had a small bowel movement, was tolerating her diet.  Pathology report was available and discussed with the patient.  She was given a prescription for Vicodin for pain.  She was given appointment to return to see me in 1 week to get her staples out.  DISCHARGE MEDICATIONS:  Include: 1. Amitriptyline 50 mg 1 to 2 tablets by mouth daily. 2. Atenolol 25 mg by mouth daily, in addition to atenolol 25 mg p.r.n.     if there are any palpitations. 3. Fiorinal 2 tablets by mouth daily as needed for headache.     Angelia Mould. Shelby [name], M.D.     HMI/MEDQ  D:  11/28/2010  T:  11/28/2010  Job:  [id number]  cc:   Bernette Redbird, M.D. Fax: [phone]  Cassell Clement, M.D. Fax: [phone]  Rande Brunt. Eda Paschal, M.D. Fax: [phone]  Electronically Signed by Claud Kelp M.D. on 11/28/2010 04:59:26 PM

## 2010-12-11 ENCOUNTER — Telehealth (INDEPENDENT_AMBULATORY_CARE_PROVIDER_SITE_OTHER): Payer: Self-pay

## 2010-12-11 ENCOUNTER — Ambulatory Visit (INDEPENDENT_AMBULATORY_CARE_PROVIDER_SITE_OTHER): Payer: Medicare Other | Admitting: Obstetrics and Gynecology

## 2010-12-11 ENCOUNTER — Encounter: Payer: Self-pay | Admitting: Obstetrics and Gynecology

## 2010-12-11 VITALS — BP 118/70

## 2010-12-11 DIAGNOSIS — N83209 Unspecified ovarian cyst, unspecified side: Secondary | ICD-10-CM

## 2010-12-11 DIAGNOSIS — R1031 Right lower quadrant pain: Secondary | ICD-10-CM

## 2010-12-11 NOTE — Progress Notes (Signed)
Patient came back to see me today for a postoperative visit. She has done well and her ovarian cyst was benign. Interestingly she had had pain on the right side we never found a cause for it. We removed her right ovary and tube and she did have endosalpingitis in it. Since the surgery she's had no further pain.  Abdomen is soft without guarding rebound or masses. Incision well healed.  Plan: We discussed findings of surgery. I think she doesn't need to return the she has a new problem. She will let us know she has any other issues. She will have routine exams through her other physicians.

## 2010-12-11 NOTE — Telephone Encounter (Signed)
Pt c/o bright red blood in her stool 4 times since surgery.  She has been having diarrhea.  She has no fever and no pain.  I spoke to Dr Michaell Cowing and he says this is normal up to 1 month postop.  She should be having 1-2 soft bowel movements per day.  She should increase fluids and take fiber.  She can take pepto bismol and kaopectate for the diarrhea.  Keep an eye on this and if it gets worse to where it's happening daily she should call us to get in sooner.  We may need to get a CBC.

## 2010-12-14 ENCOUNTER — Encounter: Payer: Self-pay | Admitting: Obstetrics and Gynecology

## 2010-12-26 ENCOUNTER — Encounter (HOSPITAL_BASED_OUTPATIENT_CLINIC_OR_DEPARTMENT_OTHER): Payer: Medicare Other | Admitting: Oncology

## 2010-12-26 DIAGNOSIS — C182 Malignant neoplasm of ascending colon: Secondary | ICD-10-CM

## 2010-12-26 DIAGNOSIS — Z853 Personal history of malignant neoplasm of breast: Secondary | ICD-10-CM

## 2010-12-27 ENCOUNTER — Ambulatory Visit (INDEPENDENT_AMBULATORY_CARE_PROVIDER_SITE_OTHER): Payer: Medicare Other | Admitting: General Surgery

## 2010-12-27 ENCOUNTER — Encounter (INDEPENDENT_AMBULATORY_CARE_PROVIDER_SITE_OTHER): Payer: Self-pay | Admitting: General Surgery

## 2010-12-27 VITALS — BP 118/78 | HR 72 | Temp 97.8°F | Resp 14 | Ht 63.0 in | Wt 133.8 lb

## 2010-12-27 DIAGNOSIS — C182 Malignant neoplasm of ascending colon: Secondary | ICD-10-CM

## 2010-12-27 NOTE — Progress Notes (Signed)
Subjective:     Patient ID: Shelby [name], female   DOB: 1940-09-28, 70 y.o.   MRN: [id number]  HPI This 70 year old woman underwent a laparoscopic-assisted right colectomy and a laparoscopic bilateral salpingo-oophorectomy on November 14, 2010. Pathology of the colon revealed an adenocarcinoma. 0/30 lymph nodes were positive. Pathologic stage T2, N0. Pathology of the ovary revealed a benign cystadenoma of the left ovary. She is followed by Dr. Eda Paschal.  She has recovered uneventfully. She is back to full activities. She has no wound problems. Her appetite is normal. She has one or 2 formed bowel movements per day. Salads and broccoli cause some diarrhea, otherwise normal. She has no pain.  She has seen Dr. Mancel Bale and he does not think she needs any adjuvant therapy. She will simply need surveillance from here on out.  Review of Systems     Objective:   Physical Exam The patient looks well. She is in good spirits. Her husband is with her.  Abdomen-Soft and nontender. Flat,   Incisions are all well healed. I can palpate a PDS suture knot at the upper end of the incision which should resolve. No infection or hernia. Nontender.   Assessment:     Invasive adenocarcinoma of the ascending colon, pathologic stage T2 N0, uneventful recovery following laparoscopic right colectomy.  Benign cystadenoma of the left ovary, uneventful recovery following laparoscopic bilateral salpingo-oophorectomy by Dr. Eda Paschal.    Plan:     She may resume all normal physical activities without restriction.  She should get a colonoscopy in one year and periodically thereafter.  She should get annual stool Hemoccults through her primary care physician.  She should get a CBC and a complete metabolic panel annually with her primary care physician.  Return to see me p.r.n.

## 2010-12-27 NOTE — Patient Instructions (Signed)
You are doing well from your right colon resection. You had seen Dr. Truett Perna and he does not think you need any further treatment. You may resume all normal physical activities without restriction. I advise that you get a colonoscopy in one year with Dr. Matthias Hughs, and periodically thereafter. I recommend that Dr. Patty Sermons obtain lab work to include a CBC and a liver panel once a year from here on out. You may return to see me if there are any further problems.

## 2010-12-31 LAB — BASIC METABOLIC PANEL
BUN: 8
CO2: 32
Calcium: 10.3
Chloride: 102
Creatinine, Ser: 0.84
Glucose, Bld: 88

## 2010-12-31 LAB — POCT HEMOGLOBIN-HEMACUE: Hemoglobin: 14.4

## 2011-01-21 ENCOUNTER — Telehealth: Payer: Self-pay | Admitting: Cardiology

## 2011-01-21 DIAGNOSIS — M62838 Other muscle spasm: Secondary | ICD-10-CM

## 2011-01-21 MED ORDER — METAXALONE 800 MG PO TABS: 800.0000 mg | ORAL_TABLET | Freq: Three times a day (TID) | ORAL | Status: AC

## 2011-01-21 NOTE — Telephone Encounter (Signed)
Looks like she took skelaxin in the past

## 2011-01-21 NOTE — Telephone Encounter (Signed)
Week and call her in Skelaxin 800 mg 1 3 times a day when necessary #30 refill x3

## 2011-01-21 NOTE — Telephone Encounter (Signed)
Pt is calling back again

## 2011-01-21 NOTE — Telephone Encounter (Signed)
Advised patient

## 2011-01-21 NOTE — Telephone Encounter (Signed)
Pt calling re medicine for muscle spasms in back, falaxin, dr Patty Sermons prescribed couple years ago and needs another rx,  uses [facility], pls call when done [phone]

## 2011-02-04 ENCOUNTER — Other Ambulatory Visit: Payer: Self-pay | Admitting: Cardiology

## 2011-02-04 DIAGNOSIS — R51 Headache: Secondary | ICD-10-CM

## 2011-02-04 NOTE — Telephone Encounter (Signed)
[name] I signed the Rx for Shelby [name] in [facility]

## 2011-02-05 NOTE — Telephone Encounter (Signed)
FU Call: Pt returning call to our office to check on status of pt fiorinal refill request. Please return pt call to discuss status of RX. Pt is out of medication and will be going out of town today (at 12:30p)

## 2011-02-05 NOTE — Telephone Encounter (Signed)
Called into pharmacy, advised patient

## 2011-03-01 ENCOUNTER — Ambulatory Visit (INDEPENDENT_AMBULATORY_CARE_PROVIDER_SITE_OTHER): Payer: Medicare Other | Admitting: Cardiology

## 2011-03-01 ENCOUNTER — Other Ambulatory Visit: Payer: Self-pay | Admitting: Cardiology

## 2011-03-01 ENCOUNTER — Other Ambulatory Visit (INDEPENDENT_AMBULATORY_CARE_PROVIDER_SITE_OTHER): Payer: Medicare Other | Admitting: *Deleted

## 2011-03-01 ENCOUNTER — Encounter: Payer: Self-pay | Admitting: Cardiology

## 2011-03-01 VITALS — BP 118/70 | HR 70 | Ht 64.0 in | Wt 130.0 lb

## 2011-03-01 DIAGNOSIS — M797 Fibromyalgia: Secondary | ICD-10-CM | POA: Insufficient documentation

## 2011-03-01 DIAGNOSIS — Z79899 Other long term (current) drug therapy: Secondary | ICD-10-CM

## 2011-03-01 DIAGNOSIS — E785 Hyperlipidemia, unspecified: Secondary | ICD-10-CM

## 2011-03-01 DIAGNOSIS — I059 Rheumatic mitral valve disease, unspecified: Secondary | ICD-10-CM

## 2011-03-01 DIAGNOSIS — E7800 Pure hypercholesterolemia, unspecified: Secondary | ICD-10-CM

## 2011-03-01 DIAGNOSIS — I341 Nonrheumatic mitral (valve) prolapse: Secondary | ICD-10-CM

## 2011-03-01 DIAGNOSIS — IMO0001 Reserved for inherently not codable concepts without codable children: Secondary | ICD-10-CM

## 2011-03-01 LAB — LIPID PANEL
Cholesterol: 307 mg/dL — ABNORMAL HIGH (ref 0–200)
HDL: 51 mg/dL (ref >39.00)
Triglycerides: 284 mg/dL — ABNORMAL HIGH (ref 0.0–149.0)
VLDL: 56.8 mg/dL — ABNORMAL HIGH (ref 0.0–40.0)

## 2011-03-01 LAB — BASIC METABOLIC PANEL
BUN: 18 mg/dL (ref 6–23)
CO2: 26 mEq/L (ref 19–32)
Calcium: 9.4 mg/dL (ref 8.4–10.5)
GFR: 65.54 mL/min (ref >60.00)
Glucose, Bld: 93 mg/dL (ref 70–99)

## 2011-03-01 LAB — HEPATIC FUNCTION PANEL
ALT: 22 U/L (ref 0–35)
AST: 28 U/L (ref 0–37)
Alkaline Phosphatase: 64 U/L (ref 39–117)
Bilirubin, Direct: 0 mg/dL (ref 0.0–0.3)
Total Bilirubin: 0.2 mg/dL — ABNORMAL LOW (ref 0.3–1.2)

## 2011-03-01 NOTE — Assessment & Plan Note (Signed)
Patient reports that she had been having severe interscapular chest discomfort secondary to her fibromyalgia.  We gave her Skelaxin and she states that the discomfort resolved after about 3 tablets and has not recurred

## 2011-03-01 NOTE — Patient Instructions (Signed)
Your physician wants you to follow-up in: 4 months, You will receive a reminder letter in the mail two months in advance. If you don't receive a letter, please call our office to schedule the follow-up appointment.  Your physician recommends that you return for a FASTING lipid profile: 4 months

## 2011-03-01 NOTE — Assessment & Plan Note (Signed)
The patient is not having any palpitations or chest pain related to her mitral valve prolapse.  She previously had a problem with dizziness and vertigo and these have also improved.

## 2011-03-01 NOTE — Progress Notes (Signed)
Calista Sherryle Lis Date of Birth:  [birth year] [facility] / [facility] [address]   [address] [place], [place]  [address] [phone]           Fax   [phone]  HPI: Pleasant 70 year old man seen for a scheduled four-month followup office visit.  She has a history of hypercholesterolemia.  He also has a history of suspected mitral valve prolapse.  His remote history of breast cancer of the right breast with reconstruction.  She had a routine colonoscopy this summer and was found to have an asymptomatic: Cancer in the right colon.  This was successfully removed.  All lymph nodes involved were negative.  She did not have to go on any chemotherapy.  She is making a good recovery and she did not have any cardiac problems related to her colon surgery.  Current Outpatient Prescriptions  Medication Sig Dispense Refill  . amitriptyline (ELAVIL) 50 MG tablet Taking one to two tablets  60 tablet  11  . atenolol (TENORMIN) 25 MG tablet Take 25 mg by mouth daily.        . Calcium Carbonate-Vitamin D (CALCIUM 600 + D PO) Take by mouth 2 (two) times daily.       . diazepam (VALIUM) 5 MG tablet Take 5 mg by mouth as needed.        Marland Kitchen FIORINAL 50-325-40 MG per capsule TAKE 1 CAPSULE EVERY 4 HOURS AS NEEDED FOR HEADACHE.  30 each  3  . metaxalone (SKELAXIN) 800 MG tablet Take 800 mg by mouth as directed.      . tretinoin (RETIN-A) 0.05 % cream Apply topically at bedtime. As directed  45 g  11  . meclizine (ANTIVERT) 25 MG tablet Take 1 tablet (25 mg total) by mouth 3 (three) times daily as needed for dizziness.  30 tablet  0    Allergies  Allergen Reactions  . Latex Rash  . Benzonatate     REACTION: migraines  . Codeine     REACTION: Nausea  . Morphine And Related     itching  . Penicillins Rash  . Sulfonamide Derivatives Rash    Patient Active Problem List  Diagnoses  . HYPERLIPIDEMIA  . MITRAL VALVE PROLAPSE  . ALLERGIC RHINITIS  . ASTHMA, CHILDHOOD  . DYSPNEA  .  COUGH  . BREAST CANCER, HX OF  . Positional vertigo    History  Smoking status  . Never Smoker   Smokeless tobacco  . Never Used    History  Alcohol Use  . 0.6 oz/week  . 1 Glasses of wine per week    Family History  Problem Relation Age of Onset  . Arthritis Mother   . Heart disease Father   . Heart attack Father   . Hypertension Father   . Cancer Son     rectal cancer-died age 33  . Diabetes Paternal Aunt   . Breast cancer Paternal Grandfather     Review of Systems: The patient denies any heat or cold intolerance.  No weight gain or weight loss.  The patient denies headaches or blurry vision.  There is no cough or sputum production.  The patient denies dizziness.  There is no hematuria or hematochezia.  The patient denies any muscle aches or arthritis.  The patient denies any rash.  The patient denies frequent falling or instability.  There is no history of depression or anxiety.  All other systems were reviewed and are negative.   Physical Exam: Ceasar Mons  Vitals:   03/01/11 1047  BP: 118/70  Pulse: 70   General appearance reveals a well-developed well-nourished woman in no distress.The head and neck exam reveals pupils equal and reactive.  Extraocular movements are full.  There is no scleral icterus.  The mouth and pharynx are normal.  The neck is supple.  The carotids reveal no bruits.  The jugular venous pressure is normal.  The  thyroid is not enlarged.  There is no lymphadenopathy.  The chest is clear to percussion and auscultation.  There are no rales or rhonchi.  Expansion of the chest is symmetrical.  The precordium is quiet.  The first heart sound is normal.  The second heart sound is physiologically split.  There is no murmur gallop rub or click.  There is no abnormal lift or heave.  The abdomen is soft and nontender.  The bowel sounds are normal.  The liver and spleen are not enlarged.  There are no abdominal masses.  The  Incision in the epigastric region is well  healed  There are no abdominal bruits.  Extremities reveal good pedal pulses.  There is no phlebitis or edema.  There is no cyanosis or clubbing.  Strength is normal and symmetrical in all extremities.  There is no lateralizing weakness.  There are no sensory deficits.  The skin is warm and dry.  There is no rash.     Assessment / Plan:  Continue same medication.  Recheck in 4 months.

## 2011-03-01 NOTE — Assessment & Plan Note (Signed)
The patient has a history of familial hypercholesterolemia.  Unfortunately she is unable to tolerate any of the statin medications.  Also she is unable to tolerate niacin Zetia. the [employer] or [employer].  We're checking fasting labs today results pending

## 2011-03-04 ENCOUNTER — Telehealth: Payer: Self-pay | Admitting: *Deleted

## 2011-03-04 NOTE — Telephone Encounter (Signed)
Advised of labs 

## 2011-03-04 NOTE — Telephone Encounter (Signed)
Message copied by Burnell Blanks on Mon Mar 04, 2011  4:23 PM ------      Message from: Cassell Clement      Created: Fri Mar 01, 2011  7:56 PM       LDL improved.

## 2011-03-04 NOTE — Telephone Encounter (Signed)
Message copied by Burnell Blanks on Mon Mar 04, 2011  4:22 PM ------      Message from: Cassell Clement      Created: Fri Mar 01, 2011  7:55 PM       Cholest and TG are better.  Chemistries normal.

## 2011-06-10 ENCOUNTER — Other Ambulatory Visit: Payer: Self-pay | Admitting: Cardiology

## 2011-06-10 NOTE — Telephone Encounter (Signed)
Refilled tenormin 

## 2011-06-18 ENCOUNTER — Telehealth: Payer: Self-pay | Admitting: Cardiology

## 2011-06-18 DIAGNOSIS — Z859 Personal history of malignant neoplasm, unspecified: Secondary | ICD-10-CM

## 2011-06-18 DIAGNOSIS — R52 Pain, unspecified: Secondary | ICD-10-CM

## 2011-06-18 NOTE — Telephone Encounter (Signed)
New msg Pt said she is having all over body aches. Not heart related. Please call

## 2011-06-18 NOTE — Telephone Encounter (Signed)
Would get a CBC and a CRP and a sed rate and a rheumatoid factor and a CMET.  Also get a chest xray.  Depending on the results of those things we may subsequently get a radioisotope bone scan.

## 2011-06-18 NOTE — Telephone Encounter (Signed)
Advised patient.  Will get labs and chest xray tomorrow

## 2011-06-18 NOTE — Telephone Encounter (Signed)
Patient states hurting in shoulders to elbows  and hips to knees.  Very concerned with her history of cancer.  States has been hurting bad for 2 weeks and that it hurts way down deep to the bone. Will forward to  Dr. Patty Sermons for review

## 2011-06-19 ENCOUNTER — Telehealth: Payer: Self-pay | Admitting: *Deleted

## 2011-06-19 ENCOUNTER — Ambulatory Visit
Admission: RE | Admit: 2011-06-19 | Discharge: 2011-06-19 | Disposition: A | Discharge status: Home / Self Care | Payer: Medicare Other | Source: Ambulatory Visit | Attending: Cardiology | Admitting: Cardiology

## 2011-06-19 ENCOUNTER — Ambulatory Visit (INDEPENDENT_AMBULATORY_CARE_PROVIDER_SITE_OTHER): Payer: Medicare Other | Admitting: *Deleted

## 2011-06-19 DIAGNOSIS — Z859 Personal history of malignant neoplasm, unspecified: Secondary | ICD-10-CM

## 2011-06-19 DIAGNOSIS — R52 Pain, unspecified: Secondary | ICD-10-CM

## 2011-06-19 LAB — BASIC METABOLIC PANEL
BUN: 12 mg/dL (ref 6–23)
CO2: 28 mEq/L (ref 19–32)
Calcium: 10.2 mg/dL (ref 8.4–10.5)
Glucose, Bld: 81 mg/dL (ref 70–99)
Potassium: 4.8 mEq/L (ref 3.5–5.1)
Sodium: 141 mEq/L (ref 135–145)

## 2011-06-19 LAB — HEPATIC FUNCTION PANEL
AST: 30 U/L (ref 0–37)
Albumin: 4.3 g/dL (ref 3.5–5.2)
Alkaline Phosphatase: 54 U/L (ref 39–117)
Total Protein: 7.5 g/dL (ref 6.0–8.3)

## 2011-06-19 LAB — CBC WITH DIFFERENTIAL/PLATELET
Basophils Relative: 0.5 % (ref 0.0–3.0)
Eosinophils Relative: 10.9 % — ABNORMAL HIGH (ref 0.0–5.0)
HCT: 42 % (ref 36.0–46.0)
Lymphs Abs: 2.2 10*3/uL (ref 0.7–4.0)
MCV: 91.3 fl (ref 78.0–100.0)
Monocytes Absolute: 0.4 10*3/uL (ref 0.1–1.0)
Monocytes Relative: 6.9 % (ref 3.0–12.0)
Platelets: 161 10*3/uL (ref 150.0–400.0)
RBC: 4.6 Mil/uL (ref 3.87–5.11)
WBC: 6 10*3/uL (ref 4.5–10.5)

## 2011-06-19 LAB — SEDIMENTATION RATE: Sed Rate: 20 mm/hr (ref 0–22)

## 2011-06-19 NOTE — Telephone Encounter (Signed)
Advised patient

## 2011-06-19 NOTE — Telephone Encounter (Signed)
Message copied by Burnell Blanks on Wed Jun 19, 2011  3:27 PM ------      Message from: Cassell Clement      Created: Wed Jun 19, 2011  1:30 PM       Please report.  The chest x-ray is stable.  No cause for her symptoms found by chest x-ray today

## 2011-06-20 LAB — C-REACTIVE PROTEIN: CRP: 0.16 mg/dL (ref <0.60)

## 2011-06-24 ENCOUNTER — Telehealth: Payer: Self-pay | Admitting: Cardiology

## 2011-06-24 DIAGNOSIS — Z85038 Personal history of other malignant neoplasm of large intestine: Secondary | ICD-10-CM

## 2011-06-24 DIAGNOSIS — M898X9 Other specified disorders of bone, unspecified site: Secondary | ICD-10-CM

## 2011-06-24 DIAGNOSIS — Z853 Personal history of malignant neoplasm of breast: Secondary | ICD-10-CM

## 2011-06-24 NOTE — Telephone Encounter (Signed)
Message copied by Burnell Blanks on Mon Jun 24, 2011  5:15 PM ------      Message from: Cassell Clement      Created: Thu Jun 20, 2011 11:39 AM       Please report.  The sedimentation rate is normal.  The CBC is normal except that the eosinophil count is elevated at 10.9% again.  The C-reactive protein is normal and the liver and kidney tests are all normal.  The blood test do not show any cause for her severe aching in her limbs.  The elevated eosinophil count suggests possible allergic reaction to something.  Is the patient on any new medicines that she could be allergic to?

## 2011-06-24 NOTE — Telephone Encounter (Signed)
Advised patient of lab results.  Patient still complaining of her bones hurting very badly.  Will discuss tomorrow with Lawson Fiscal NP since  Dr. Patty Sermons out this week

## 2011-06-24 NOTE — Telephone Encounter (Signed)
New Problem:     Patient called wanting to know the results of her blood test.  Please call back.

## 2011-06-25 NOTE — Telephone Encounter (Signed)
Scheduled bone scan at [facility] radiology for 4/17 injection at 10:00 and return for scan at 1:00 pm.  Ordered per Lawson Fiscal. Patient will call and get follow up with Dr Truett Perna.  In regards to her fibromyalgia she was diagnosed by Dr Jane Canary but had not seen her in about 4 years.  Will await results of scan.

## 2011-07-03 ENCOUNTER — Encounter (HOSPITAL_COMMUNITY)
Admission: RE | Admit: 2011-07-03 | Discharge: 2011-07-03 | Disposition: A | Discharge status: Home / Self Care | Payer: Medicare Other | Source: Ambulatory Visit | Attending: Cardiology | Admitting: Cardiology

## 2011-07-03 DIAGNOSIS — M898X9 Other specified disorders of bone, unspecified site: Secondary | ICD-10-CM

## 2011-07-03 DIAGNOSIS — Z853 Personal history of malignant neoplasm of breast: Secondary | ICD-10-CM

## 2011-07-03 DIAGNOSIS — M25519 Pain in unspecified shoulder: Secondary | ICD-10-CM | POA: Insufficient documentation

## 2011-07-03 DIAGNOSIS — C50919 Malignant neoplasm of unspecified site of unspecified female breast: Secondary | ICD-10-CM | POA: Insufficient documentation

## 2011-07-03 DIAGNOSIS — M25559 Pain in unspecified hip: Secondary | ICD-10-CM | POA: Insufficient documentation

## 2011-07-03 DIAGNOSIS — Z85038 Personal history of other malignant neoplasm of large intestine: Secondary | ICD-10-CM

## 2011-07-03 DIAGNOSIS — M79609 Pain in unspecified limb: Secondary | ICD-10-CM | POA: Insufficient documentation

## 2011-07-03 DIAGNOSIS — C189 Malignant neoplasm of colon, unspecified: Secondary | ICD-10-CM | POA: Insufficient documentation

## 2011-07-03 MED ORDER — TECHNETIUM TC 99M MEDRONATE IV KIT
24.0000 | PACK | Freq: Once | INTRAVENOUS | Status: AC | PRN
  Administered 2011-07-03: 24 via INTRAVENOUS

## 2011-07-04 ENCOUNTER — Telehealth: Payer: Self-pay | Admitting: *Deleted

## 2011-07-04 ENCOUNTER — Other Ambulatory Visit (INDEPENDENT_AMBULATORY_CARE_PROVIDER_SITE_OTHER): Payer: Medicare Other

## 2011-07-04 DIAGNOSIS — M797 Fibromyalgia: Secondary | ICD-10-CM

## 2011-07-04 DIAGNOSIS — R948 Abnormal results of function studies of other organs and systems: Secondary | ICD-10-CM

## 2011-07-04 DIAGNOSIS — I059 Rheumatic mitral valve disease, unspecified: Secondary | ICD-10-CM

## 2011-07-04 DIAGNOSIS — E7800 Pure hypercholesterolemia, unspecified: Secondary | ICD-10-CM

## 2011-07-04 DIAGNOSIS — I341 Nonrheumatic mitral (valve) prolapse: Secondary | ICD-10-CM

## 2011-07-04 DIAGNOSIS — IMO0001 Reserved for inherently not codable concepts without codable children: Secondary | ICD-10-CM

## 2011-07-04 LAB — LIPID PANEL
Cholesterol: 271 mg/dL — ABNORMAL HIGH (ref 0–200)
HDL: 49.7 mg/dL (ref >39.00)
Total CHOL/HDL Ratio: 5
Triglycerides: 216 mg/dL — ABNORMAL HIGH (ref 0.0–149.0)
VLDL: 43.2 mg/dL — ABNORMAL HIGH (ref 0.0–40.0)

## 2011-07-04 LAB — BASIC METABOLIC PANEL
Chloride: 103 mEq/L (ref 96–112)
GFR: 70.9 mL/min (ref >60.00)
Potassium: 4.5 mEq/L (ref 3.5–5.1)
Sodium: 139 mEq/L (ref 135–145)

## 2011-07-04 LAB — HEPATIC FUNCTION PANEL
ALT: 21 U/L (ref 0–35)
AST: 27 U/L (ref 0–37)
Alkaline Phosphatase: 52 U/L (ref 39–117)

## 2011-07-04 NOTE — Telephone Encounter (Signed)
Message copied by Burnell Blanks on Thu Jul 04, 2011  3:57 PM ------      Message from: Cassell Clement      Created: Wed Jul 03, 2011  4:05 PM       Please report.  The bone scan was unremarkable except for one area of lumbar spine that radiologist was not sure about. We should get MRI of lumbar spine as radiologist suggests.

## 2011-07-04 NOTE — Progress Notes (Signed)
Quick Note:  Please make copy of labs for patient visit. ______ 

## 2011-07-04 NOTE — Telephone Encounter (Signed)
Scheduled MRI for Sunday 4/21 and advised patient.  Advised of results

## 2011-07-07 ENCOUNTER — Ambulatory Visit (HOSPITAL_COMMUNITY)
Admission: RE | Admit: 2011-07-07 | Discharge: 2011-07-07 | Disposition: A | Discharge status: Home / Self Care | Payer: Medicare Other | Source: Ambulatory Visit | Attending: Cardiology | Admitting: Cardiology

## 2011-07-07 DIAGNOSIS — R948 Abnormal results of function studies of other organs and systems: Secondary | ICD-10-CM

## 2011-07-07 DIAGNOSIS — M5137 Other intervertebral disc degeneration, lumbosacral region: Secondary | ICD-10-CM | POA: Insufficient documentation

## 2011-07-07 DIAGNOSIS — M51379 Other intervertebral disc degeneration, lumbosacral region without mention of lumbar back pain or lower extremity pain: Secondary | ICD-10-CM | POA: Insufficient documentation

## 2011-07-07 DIAGNOSIS — M129 Arthropathy, unspecified: Secondary | ICD-10-CM | POA: Insufficient documentation

## 2011-07-07 DIAGNOSIS — Z85038 Personal history of other malignant neoplasm of large intestine: Secondary | ICD-10-CM | POA: Insufficient documentation

## 2011-07-07 DIAGNOSIS — Z853 Personal history of malignant neoplasm of breast: Secondary | ICD-10-CM | POA: Insufficient documentation

## 2011-07-07 DIAGNOSIS — M7130 Other bursal cyst, unspecified site: Secondary | ICD-10-CM | POA: Insufficient documentation

## 2011-07-07 MED ORDER — GADOBENATE DIMEGLUMINE 529 MG/ML IV SOLN
12.0000 mL | Freq: Once | INTRAVENOUS | Status: AC | PRN
  Administered 2011-07-07: 12 mL via INTRAVENOUS

## 2011-07-08 ENCOUNTER — Telehealth: Payer: Self-pay | Admitting: *Deleted

## 2011-07-08 NOTE — Telephone Encounter (Signed)
Message copied by Burnell Blanks on Mon Jul 08, 2011  3:54 PM ------      Message from: Cassell Clement      Created: Mon Jul 08, 2011  3:28 PM       Please report.  MRI shows no cancer of the spine.

## 2011-07-08 NOTE — Telephone Encounter (Signed)
Advised of results

## 2011-07-11 ENCOUNTER — Ambulatory Visit (INDEPENDENT_AMBULATORY_CARE_PROVIDER_SITE_OTHER): Payer: Medicare Other | Admitting: Cardiology

## 2011-07-11 ENCOUNTER — Encounter: Payer: Self-pay | Admitting: Cardiology

## 2011-07-11 VITALS — BP 100/70 | HR 74 | Ht 64.0 in | Wt 133.0 lb

## 2011-07-11 DIAGNOSIS — IMO0001 Reserved for inherently not codable concepts without codable children: Secondary | ICD-10-CM

## 2011-07-11 DIAGNOSIS — E7800 Pure hypercholesterolemia, unspecified: Secondary | ICD-10-CM

## 2011-07-11 DIAGNOSIS — E785 Hyperlipidemia, unspecified: Secondary | ICD-10-CM

## 2011-07-11 DIAGNOSIS — I341 Nonrheumatic mitral (valve) prolapse: Secondary | ICD-10-CM

## 2011-07-11 DIAGNOSIS — I059 Rheumatic mitral valve disease, unspecified: Secondary | ICD-10-CM

## 2011-07-11 DIAGNOSIS — M797 Fibromyalgia: Secondary | ICD-10-CM

## 2011-07-11 NOTE — Assessment & Plan Note (Signed)
The patient has a past history of hypercholesterolemia.  She is intolerant of lipid lowering medication.  Fortunately on her own her numbers are better this time.  She will continue on a low cholesterol diet.

## 2011-07-11 NOTE — Assessment & Plan Note (Signed)
The patient has been experiencing intermittent deep bone pain.  She has seen a rheumatologist in the past who suggested that she might have fibromyalgia and suggested a trial of Lyrica.  The patient has not wanted to go on Lyrica.  Recent blood work here was unremarkable including a normal rheumatoid factor.  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

## 2011-07-11 NOTE — Assessment & Plan Note (Signed)
The patient is not having any symptoms referable to her mitral valve prolapse.

## 2011-07-11 NOTE — Progress Notes (Signed)
Shelby [name] Date of Birth:  13-Sep-1940 [facility] [address] [place], [place]  [address] [phone]  Fax   [phone]  HPI: This pleasant 71 year old woman is seen for a scheduled followup office visit.  She has a past history of upper cholesterol anemia and suspected mitral valve prolapse.  She also has a past history of breast cancer of the right breast with reconstruction.  She also more recently in the summer of 2012 on a routine colonoscopy was found to have cancer of the right colon which was asymptomatic and this was successfully removed.  All lymph nodes were negative and she did not require any subsequent chemotherapy.  She is doing well postop except that she tends to be more prone to diarrhea and loose stools now that this section of her colon has been removed.  The patient recently also was complaining of a lot of pain deep in her bones.  We obtained a PET scan which did not show any definite cancer but raise the question of an abnormality in her spine.  However subsequent MRI did not show any cancer of her spine.  Current Outpatient Prescriptions  Medication Sig Dispense Refill  . amitriptyline (ELAVIL) 50 MG tablet Taking one to two tablets  60 tablet  11  . Calcium Carbonate-Vitamin D (CALCIUM 600 + D PO) Take by mouth 2 (two) times daily.       . diazepam (VALIUM) 5 MG tablet Take 5 mg by mouth as needed.        Marland Kitchen FIORINAL 50-325-40 MG per capsule TAKE 1 CAPSULE EVERY 4 HOURS AS NEEDED FOR HEADACHE.  30 each  3  . metaxalone (SKELAXIN) 800 MG tablet Take 800 mg by mouth as directed.      . Naproxen Sodium (ALEVE PO) Take by mouth. As needed      . TENORMIN 25 MG tablet TAKE 1 OR 2 TABLETS DAILY  60 each  11  . tretinoin (RETIN-A) 0.05 % cream Apply topically at bedtime. As directed  45 g  11  . meclizine (ANTIVERT) 25 MG tablet Take 1 tablet (25 mg total) by mouth 3 (three) times daily as needed for dizziness.  30 tablet  0    Allergies    Allergen Reactions  . Latex Rash  . Benzonatate     REACTION: migraines  . Codeine     REACTION: Nausea  . Morphine And Related     itching  . Penicillins Rash  . Sulfonamide Derivatives Rash    Patient Active Problem List  Diagnoses  . HYPERLIPIDEMIA  . MITRAL VALVE PROLAPSE  . ALLERGIC RHINITIS  . ASTHMA, CHILDHOOD  . DYSPNEA  . COUGH  . BREAST CANCER, HX OF  . Positional vertigo  . Fibromyalgia    History  Smoking status  . Never Smoker   Smokeless tobacco  . Never Used    History  Alcohol Use  . 0.6 oz/week  . 1 Glasses of wine per week    Family History  Problem Relation Age of Onset  . Arthritis Mother   . Heart disease Father   . Heart attack Father   . Hypertension Father   . Cancer Son     rectal cancer-died age 30  . Diabetes Paternal Aunt   . Breast cancer Paternal Grandfather     Review of Systems: The patient denies any heat or cold intolerance.  No weight gain or weight loss.  The patient denies headaches or blurry  vision.  There is no cough or sputum production.  The patient denies dizziness.  There is no hematuria or hematochezia.  The patient denies any muscle aches or arthritis.  The patient denies any rash.  The patient denies frequent falling or instability.  There is no history of depression or anxiety.  All other systems were reviewed and are negative.   Physical Exam: Filed Vitals:   07/11/11 1409  BP: 100/70  Pulse: 74   the general appearance reveals a well-developed well-nourished woman in no distress.The head and neck exam reveals pupils equal and reactive.  Extraocular movements are full.  There is no scleral icterus.  The mouth and pharynx are normal.  The neck is supple.  The carotids reveal no bruits.  The jugular venous pressure is normal.  The  thyroid is not enlarged.  There is no lymphadenopathy.  The chest is clear to percussion and auscultation.  There are no rales or rhonchi.  Expansion of the chest is symmetrical.   The precordium is quiet.  The first heart sound is normal.  The second heart sound is physiologically split.  There is no murmur gallop rub.  I do not hear a mitral click today.  There is no abnormal lift or heave.  The abdomen is soft and nontender.  The bowel sounds are normal.  The liver and spleen are not enlarged.  There are no abdominal masses.  There are no abdominal bruits.  Extremities reveal good pedal pulses.  There is no phlebitis or edema.  There is no cyanosis or clubbing.  Strength is normal and symmetrical in all extremities.  There is no lateralizing weakness.  There are no sensory deficits.  The skin is warm and dry.  There is no rash.      Assessment / Plan: Her lab work was reviewed.  She will continue same medication.  Recheck in 4 months for a followup office visit CBC and fasting lab work

## 2011-07-11 NOTE — Patient Instructions (Signed)
Your physician recommends that you continue on your current medications as directed. Please refer to the Current Medication list given to you today.  Your physician recommends that you schedule a follow-up appointment in: 4 months with fasting labs (lp/bmet/hfp/cbc)  

## 2011-07-25 ENCOUNTER — Other Ambulatory Visit: Payer: Self-pay | Admitting: Obstetrics and Gynecology

## 2011-07-25 DIAGNOSIS — Z853 Personal history of malignant neoplasm of breast: Secondary | ICD-10-CM

## 2011-07-25 DIAGNOSIS — Z1231 Encounter for screening mammogram for malignant neoplasm of breast: Secondary | ICD-10-CM

## 2011-08-14 ENCOUNTER — Ambulatory Visit
Admission: RE | Admit: 2011-08-14 | Discharge: 2011-08-14 | Disposition: A | Discharge status: Home / Self Care | Payer: Medicare Other | Source: Ambulatory Visit | Attending: Obstetrics and Gynecology | Admitting: Obstetrics and Gynecology

## 2011-08-14 DIAGNOSIS — Z853 Personal history of malignant neoplasm of breast: Secondary | ICD-10-CM

## 2011-09-30 ENCOUNTER — Other Ambulatory Visit: Payer: Self-pay | Admitting: Cardiology

## 2011-11-11 ENCOUNTER — Ambulatory Visit (INDEPENDENT_AMBULATORY_CARE_PROVIDER_SITE_OTHER): Payer: Medicare Other | Admitting: Cardiology

## 2011-11-11 ENCOUNTER — Encounter: Payer: Self-pay | Admitting: Cardiology

## 2011-11-11 ENCOUNTER — Other Ambulatory Visit (INDEPENDENT_AMBULATORY_CARE_PROVIDER_SITE_OTHER): Payer: Medicare Other

## 2011-11-11 VITALS — BP 118/70 | HR 70 | Ht 64.0 in | Wt 133.0 lb

## 2011-11-11 DIAGNOSIS — M797 Fibromyalgia: Secondary | ICD-10-CM

## 2011-11-11 DIAGNOSIS — F419 Anxiety disorder, unspecified: Secondary | ICD-10-CM

## 2011-11-11 DIAGNOSIS — E7800 Pure hypercholesterolemia, unspecified: Secondary | ICD-10-CM

## 2011-11-11 DIAGNOSIS — C189 Malignant neoplasm of colon, unspecified: Secondary | ICD-10-CM | POA: Insufficient documentation

## 2011-11-11 DIAGNOSIS — R0989 Other specified symptoms and signs involving the circulatory and respiratory systems: Secondary | ICD-10-CM

## 2011-11-11 DIAGNOSIS — I341 Nonrheumatic mitral (valve) prolapse: Secondary | ICD-10-CM

## 2011-11-11 DIAGNOSIS — F411 Generalized anxiety disorder: Secondary | ICD-10-CM

## 2011-11-11 DIAGNOSIS — I059 Rheumatic mitral valve disease, unspecified: Secondary | ICD-10-CM

## 2011-11-11 DIAGNOSIS — IMO0001 Reserved for inherently not codable concepts without codable children: Secondary | ICD-10-CM

## 2011-11-11 DIAGNOSIS — E785 Hyperlipidemia, unspecified: Secondary | ICD-10-CM

## 2011-11-11 LAB — HEPATIC FUNCTION PANEL
ALT: 19 U/L (ref 0–35)
AST: 29 U/L (ref 0–37)
Alkaline Phosphatase: 44 U/L (ref 39–117)
Total Bilirubin: 0.8 mg/dL (ref 0.3–1.2)

## 2011-11-11 LAB — BASIC METABOLIC PANEL
BUN: 15 mg/dL (ref 6–23)
Calcium: 9.7 mg/dL (ref 8.4–10.5)
Creatinine, Ser: 0.8 mg/dL (ref 0.4–1.2)
GFR: 70.83 mL/min (ref >60.00)
Glucose, Bld: 80 mg/dL (ref 70–99)
Sodium: 140 mEq/L (ref 135–145)

## 2011-11-11 LAB — CBC WITH DIFFERENTIAL/PLATELET
Basophils Absolute: 0 10*3/uL (ref 0.0–0.1)
Eosinophils Absolute: 1.2 10*3/uL — ABNORMAL HIGH (ref 0.0–0.7)
Hemoglobin: 14.4 g/dL (ref 12.0–15.0)
Lymphocytes Relative: 35.7 % (ref 12.0–46.0)
MCHC: 33.4 g/dL (ref 30.0–36.0)
Monocytes Relative: 8.1 % (ref 3.0–12.0)
Neutrophils Relative %: 35.9 % — ABNORMAL LOW (ref 43.0–77.0)
Platelets: 148 10*3/uL — ABNORMAL LOW (ref 150.0–400.0)
RDW: 12.8 % (ref 11.5–14.6)

## 2011-11-11 LAB — LIPID PANEL: Triglycerides: 303 mg/dL — ABNORMAL HIGH (ref 0.0–149.0)

## 2011-11-11 LAB — LDL CHOLESTEROL, DIRECT: Direct LDL: 204.3 mg/dL

## 2011-11-11 MED ORDER — DIAZEPAM 5 MG PO TABS: 5.0000 mg | ORAL_TABLET | ORAL | Status: DC | PRN

## 2011-11-11 MED ORDER — METAXALONE 800 MG PO TABS: 800.0000 mg | ORAL_TABLET | Freq: Two times a day (BID) | ORAL | Status: DC | PRN

## 2011-11-11 NOTE — Progress Notes (Signed)
Quick Note:  Please report to patient. The recent labs are stable. Continue same medication and careful diet. Cholesterol and LDL not as good. Work harder on diet. ______

## 2011-11-11 NOTE — Assessment & Plan Note (Signed)
The patient has been trying to get more regular exercise.  [place] on a regular basis.  No recent severe chest pain or palpitations or tachycardia

## 2011-11-11 NOTE — Assessment & Plan Note (Signed)
The patient has a history of hyperlipidemia.  She is intolerant to most statins.  Blood work today is pending

## 2011-11-11 NOTE — Patient Instructions (Addendum)
Will obtain labs today and call you with the results (lp/bmet/hfp/cbc)  Your physician recommends that you continue on your current medications as directed. Please refer to the Current Medication list given to you today.  Your physician recommends that you schedule a follow-up appointment in: 4 months with fasting labs (lp/bmet/hfp)  

## 2011-11-11 NOTE — Assessment & Plan Note (Signed)
The patient will be undergoing a repeat colonoscopy in September.  Her initial colon cancer was found and a routine screening colonoscopy and she did not have any symptoms.

## 2011-11-11 NOTE — Progress Notes (Signed)
Shelby [name] Date of Birth:  09-Feb-1941 [facility] [address] [place], [place]  [address] [phone]  Fax   [phone]  HPI: This pleasant 71 year old woman is seen for a scheduled followup office visit. She has a past history of hypercholesterolemia, anemia and suspected mitral valve prolapse. She also has a past history of breast cancer of the right breast with reconstruction. She also more recently in the summer of 2012 on a routine colonoscopy was found to have cancer of the right colon which was asymptomatic and this was successfully removed. All lymph nodes were negative and she did not require any subsequent chemotherapy. She is doing well postop except that she tends to be more prone to diarrhea and loose stools now that this section of her colon has been removed. The patient recently also was complaining of a lot of pain deep in her bones. We obtained a PET scan which did not show any definite cancer but raise the question of an abnormality in her spine. However subsequent MRI did not show any cancer of her spine. The patient has a history of probable fibromyalgia.  She has had a trial of Skelaxin.  She is also found that the Valium at bedtime helps as much as anything.   Current Outpatient Prescriptions  Medication Sig Dispense Refill  . amitriptyline (ELAVIL) 50 MG tablet Taking one to two tablets  60 tablet  11  . Calcium Carbonate-Vitamin D (CALCIUM 600 + D PO) Take by mouth 2 (two) times daily.       . diazepam (VALIUM) 5 MG tablet Take 1 tablet (5 mg total) by mouth as needed.  30 tablet  5  . FIORINAL 50-325-40 MG per capsule TAKE 1 CAPSULE EVERY 4 HOURS AS NEEDED FOR HEADACHE.  30 each  3  . metaxalone (SKELAXIN) 800 MG tablet Take 1 tablet (800 mg total) by mouth 2 (two) times daily as needed for pain.  60 tablet  5  . Naproxen Sodium (ALEVE PO) Take by mouth. As needed      . RENOVA 0.05 % CREA APPLY AT BEDTIME  46 g  9  . TENORMIN 25 MG  tablet TAKE 1 OR 2 TABLETS DAILY  60 each  11  . DISCONTD: diazepam (VALIUM) 5 MG tablet Take 5 mg by mouth as needed.        . meclizine (ANTIVERT) 25 MG tablet Take 1 tablet (25 mg total) by mouth 3 (three) times daily as needed for dizziness.  30 tablet  0    Allergies  Allergen Reactions  . Latex Rash  . Benzonatate     REACTION: migraines  . Codeine     REACTION: Nausea  . Morphine And Related     itching  . Penicillins Rash  . Sulfonamide Derivatives Rash    Patient Active Problem List  Diagnosis  . HYPERLIPIDEMIA  . MITRAL VALVE PROLAPSE  . ALLERGIC RHINITIS  . ASTHMA, CHILDHOOD  . DYSPNEA  . COUGH  . BREAST CANCER, HX OF  . Positional vertigo  . Fibromyalgia    History  Smoking status  . Never Smoker   Smokeless tobacco  . Never Used    History  Alcohol Use  . 0.6 oz/week  . 1 Glasses of wine per week    Family History  Problem Relation Age of Onset  . Arthritis Mother   . Heart disease Father   . Heart attack Father   . Hypertension Father   .  Cancer Son     rectal cancer-died age 46  . Diabetes Paternal Aunt   . Breast cancer Paternal Grandfather     Review of Systems: The patient denies any heat or cold intolerance.  No weight gain or weight loss.  The patient denies headaches or blurry vision.  There is no cough or sputum production.  The patient denies dizziness.  There is no hematuria or hematochezia.  The patient denies any muscle aches or arthritis.  The patient denies any rash.  The patient denies frequent falling or instability.  There is no history of depression or anxiety.  All other systems were reviewed and are negative.   Physical Exam: Filed Vitals:   11/11/11 0937  BP: 118/70  Pulse: 70   the general appearance reveals a well-developed well-nourished woman in no distress.The head and neck exam reveals pupils equal and reactive.  Extraocular movements are full.  There is no scleral icterus.  The mouth and pharynx are normal.   The neck is supple.  The carotids reveal no bruits.  The jugular venous pressure is normal.  The  thyroid is not enlarged.  There is no lymphadenopathy.  The chest is clear to percussion and auscultation.  There are no rales or rhonchi.  Expansion of the chest is symmetrical.  The precordium is quiet.  The first heart sound is normal.  The second heart sound is physiologically split.  There is a faint systolic ejection murmur at the left sternal edge There is no abnormal lift or heave.  The abdomen is soft and nontender.  The bowel sounds are normal.  The liver and spleen are not enlarged.  There are no abdominal masses.  There are no abdominal bruits.  Extremities reveal good pedal pulses.  There is no phlebitis or edema.  There is no cyanosis or clubbing.  Strength is normal and symmetrical in all extremities.  There is no lateralizing weakness.  There are no sensory deficits.  The skin is warm and dry.  There is no rash.     Assessment / Plan: Continue same medication and be rechecked in 4 months for followup office visit and fasting lab work

## 2011-11-12 ENCOUNTER — Telehealth: Payer: Self-pay | Admitting: *Deleted

## 2011-11-12 NOTE — Telephone Encounter (Signed)
Mailed copy and highlighted  Dr. Brackbill's comments, no answer at home number 

## 2011-11-12 NOTE — Telephone Encounter (Signed)
Message copied by Burnell Blanks on Tue Nov 12, 2011  2:11 PM ------      Message from: Cassell Clement      Created: Mon Nov 11, 2011  2:50 PM       Please report to patient.  The recent labs are stable. Continue same medication and careful diet. Cholesterol and LDL not as good.  Work harder on diet.

## 2011-11-30 ENCOUNTER — Other Ambulatory Visit: Payer: Self-pay | Admitting: Cardiology

## 2011-11-30 DIAGNOSIS — F329 Major depressive disorder, single episode, unspecified: Secondary | ICD-10-CM

## 2011-12-02 ENCOUNTER — Other Ambulatory Visit: Payer: Self-pay | Admitting: Gastroenterology

## 2011-12-02 NOTE — Telephone Encounter (Signed)
Refilled elavil with 3 refills

## 2011-12-30 ENCOUNTER — Other Ambulatory Visit: Payer: Self-pay | Admitting: Cardiology

## 2011-12-30 DIAGNOSIS — R51 Headache: Secondary | ICD-10-CM

## 2012-01-03 NOTE — Telephone Encounter (Signed)
Spoke with [facility] ok x 2

## 2012-01-03 NOTE — Telephone Encounter (Signed)
Follow up.

## 2012-01-06 ENCOUNTER — Encounter: Payer: Self-pay | Admitting: Cardiology

## 2012-02-24 ENCOUNTER — Other Ambulatory Visit (INDEPENDENT_AMBULATORY_CARE_PROVIDER_SITE_OTHER): Payer: Medicare Other

## 2012-02-24 ENCOUNTER — Encounter: Payer: Self-pay | Admitting: Cardiology

## 2012-02-24 ENCOUNTER — Ambulatory Visit (INDEPENDENT_AMBULATORY_CARE_PROVIDER_SITE_OTHER): Payer: Medicare Other | Admitting: Cardiology

## 2012-02-24 VITALS — BP 128/76 | HR 70 | Ht 64.0 in | Wt 134.2 lb

## 2012-02-24 DIAGNOSIS — I341 Nonrheumatic mitral (valve) prolapse: Secondary | ICD-10-CM

## 2012-02-24 DIAGNOSIS — E7800 Pure hypercholesterolemia, unspecified: Secondary | ICD-10-CM

## 2012-02-24 DIAGNOSIS — C189 Malignant neoplasm of colon, unspecified: Secondary | ICD-10-CM

## 2012-02-24 DIAGNOSIS — I059 Rheumatic mitral valve disease, unspecified: Secondary | ICD-10-CM

## 2012-02-24 DIAGNOSIS — E785 Hyperlipidemia, unspecified: Secondary | ICD-10-CM

## 2012-02-24 LAB — LIPID PANEL
Cholesterol: 279 mg/dL — ABNORMAL HIGH (ref 0–200)
HDL: 47.1 mg/dL (ref >39.00)
Triglycerides: 248 mg/dL — ABNORMAL HIGH (ref 0.0–149.0)

## 2012-02-24 LAB — HEPATIC FUNCTION PANEL
ALT: 21 U/L (ref 0–35)
AST: 34 U/L (ref 0–37)
Albumin: 4.1 g/dL (ref 3.5–5.2)
Alkaline Phosphatase: 44 U/L (ref 39–117)

## 2012-02-24 LAB — BASIC METABOLIC PANEL
CO2: 23 mEq/L (ref 19–32)
Calcium: 9.8 mg/dL (ref 8.4–10.5)
Creatinine, Ser: 0.8 mg/dL (ref 0.4–1.2)
GFR: 75.97 mL/min (ref >60.00)
Sodium: 136 mEq/L (ref 135–145)

## 2012-02-24 NOTE — Assessment & Plan Note (Signed)
The patient has loose stools if she eats very many vegetables.  The loose stools are secondary to having had colon cancer.  She had a colonoscopy in September and does not need another one for 2 years.  Dr. Matthias Hughs is her gastroenterologist

## 2012-02-24 NOTE — Assessment & Plan Note (Addendum)
The patient has a history of hyperlipidemia.  She is intolerant of most medications for cholesterol.  She is trying to stay on a low-cholesterol diet.  Blood work today is pending.

## 2012-02-24 NOTE — Patient Instructions (Addendum)

## 2012-02-24 NOTE — Progress Notes (Signed)
Quick Note:  Please report to patient. The recent labs are stable. Continue same medication and careful diet. Cholesterol is 279 which is better ______

## 2012-02-24 NOTE — Progress Notes (Signed)
Sim Boast Date of Birth:  1940-08-12 [facility] [address] [place], [place]  [address] [phone]  Fax   [phone]  HPI: This pleasant 71 year old woman is seen for a scheduled followup office visit. She has a past history of hypercholesterolemia, anemia and suspected mitral valve prolapse. She also has a past history of breast cancer of the right breast with reconstruction. She also more recently in the summer of 2012 on a routine colonoscopy was found to have cancer of the right colon which was asymptomatic and this was successfully removed. All lymph nodes were negative and she did not require any subsequent chemotherapy. She is doing well postop except that she tends to be more prone to diarrhea and loose stools now that this section of her colon has been removed. The patient recently also was complaining of a lot of pain deep in her bones. We obtained a PET scan which did not show any definite cancer but raise the question of an abnormality in her spine. However subsequent MRI did not show any cancer of her spine.  The patient has a history of probable fibromyalgia. She has had a trial of Skelaxin. She is also found that the Valium at bedtime helps as much as anything.    Current Outpatient Prescriptions  Medication Sig Dispense Refill  . Calcium Carbonate-Vitamin D (CALCIUM 600 + D PO) Take by mouth 2 (two) times daily.       . diazepam (VALIUM) 5 MG tablet Take 1 tablet (5 mg total) by mouth as needed.  30 tablet  5  . ELAVIL 50 MG tablet TAKE 1 OR 2 TABLETS AS DIRECTED.  60 tablet  3  . FIORINAL 50-325-40 MG per capsule TAKE 1 CAPSULE EVERY 4 HOURS AS NEEDED FOR HEADACHE.  30 capsule  1  . metaxalone (SKELAXIN) 800 MG tablet Take 1 tablet (800 mg total) by mouth 2 (two) times daily as needed for pain.  60 tablet  5  . Naproxen Sodium (ALEVE PO) Take by mouth. As needed      . RENOVA 0.05 % CREA APPLY AT BEDTIME  46 g  9  . TENORMIN 25 MG tablet  TAKE 1 OR 2 TABLETS DAILY  60 each  11  . meclizine (ANTIVERT) 25 MG tablet Take 1 tablet (25 mg total) by mouth 3 (three) times daily as needed for dizziness.  30 tablet  0    Allergies  Allergen Reactions  . Latex Rash  . Benzonatate     REACTION: migraines  . Codeine     REACTION: Nausea  . Morphine And Related     itching  . Penicillins Rash  . Sulfonamide Derivatives Rash    Patient Active Problem List  Diagnosis  . HYPERLIPIDEMIA  . MITRAL VALVE PROLAPSE  . ALLERGIC RHINITIS  . ASTHMA, CHILDHOOD  . DYSPNEA  . COUGH  . BREAST CANCER, HX OF  . Positional vertigo  . Fibromyalgia  . Colon cancer    History  Smoking status  . Never Smoker   Smokeless tobacco  . Never Used    History  Alcohol Use  . 0.6 oz/week  . 1 Glasses of wine per week    Family History  Problem Relation Age of Onset  . Arthritis Mother   . Heart disease Father   . Heart attack Father   . Hypertension Father   . Cancer Son     rectal cancer-died age 24  . Diabetes Paternal Aunt   .  Breast cancer Paternal Grandfather     Review of Systems: The patient denies any heat or cold intolerance.  No weight gain or weight loss.  The patient denies headaches or blurry vision.  There is no cough or sputum production.  The patient denies dizziness.  There is no hematuria or hematochezia.  The patient denies any muscle aches or arthritis.  The patient denies any rash.  The patient denies frequent falling or instability.  There is no history of depression or anxiety.  All other systems were reviewed and are negative.   Physical Exam: Filed Vitals:   02/24/12 1024  BP: 128/76  Pulse: 70   general appearance reveals a well-developed well-nourished woman in no distress.The head and neck exam reveals pupils equal and reactive.  Extraocular movements are full.  There is no scleral icterus.  The mouth and pharynx are normal.  The neck is supple.  The carotids reveal no bruits.  The jugular venous  pressure is normal.  The  thyroid is not enlarged.  There is no lymphadenopathy.  The chest is clear to percussion and auscultation.  There are no rales or rhonchi.  Expansion of the chest is symmetrical.  The precordium is quiet.  The first heart sound is normal.  The second heart sound is physiologically split.  There is no murmur gallop rub or click.  There is no abnormal lift or heave.  The abdomen is soft and nontender.  The bowel sounds are normal.  The liver and spleen are not enlarged.  There are no abdominal masses.  There are no abdominal bruits.  Extremities reveal good pedal pulses.  There is no phlebitis or edema.  There is no cyanosis or clubbing.  Strength is normal and symmetrical in all extremities.  There is no lateralizing weakness.  There are no sensory deficits.  The skin is warm and dry.  There is no rash.      Assessment / Plan: Continue same medication.  Recheck in 4 months for followup office visit and fasting lab work.

## 2012-02-24 NOTE — Assessment & Plan Note (Signed)
The patient has not been experiencing any chest pain or palpitations 

## 2012-02-26 ENCOUNTER — Telehealth: Payer: Self-pay | Admitting: *Deleted

## 2012-02-26 NOTE — Telephone Encounter (Signed)
Advised patient of lab results  

## 2012-02-26 NOTE — Telephone Encounter (Signed)
Message copied by Burnell Blanks on Wed Feb 26, 2012 10:54 AM ------      Message from: Cassell Clement      Created: Mon Feb 24, 2012  5:02 PM       Please report to patient.  The recent labs are stable. Continue same medication and careful diet.  Cholesterol is 279 which is better

## 2012-05-02 ENCOUNTER — Other Ambulatory Visit: Payer: Self-pay

## 2012-05-28 ENCOUNTER — Ambulatory Visit (INDEPENDENT_AMBULATORY_CARE_PROVIDER_SITE_OTHER): Payer: Medicare Other | Admitting: Family Medicine

## 2012-05-28 ENCOUNTER — Telehealth: Payer: Self-pay | Admitting: Cardiology

## 2012-05-28 VITALS — BP 122/86 | HR 75 | Temp 97.6°F | Resp 16 | Ht 63.0 in | Wt 136.4 lb

## 2012-05-28 DIAGNOSIS — R51 Headache: Secondary | ICD-10-CM

## 2012-05-28 LAB — POCT CBC
Granulocyte percent: 46.3 %G (ref 37–80)
HCT, POC: 47 % (ref 37.7–47.9)
Hemoglobin: 14.9 g/dL (ref 12.2–16.2)
Lymph, poc: 2.2 (ref 0.6–3.4)
MCHC: 31.7 g/dL — AB (ref 31.8–35.4)
MCV: 92.7 fL (ref 80–97)
POC Granulocyte: 2.3 (ref 2–6.9)
POC LYMPH PERCENT: 43.2 %L (ref 10–50)
RDW, POC: 13.2 %

## 2012-05-28 LAB — POCT SEDIMENTATION RATE: POCT SED RATE: 32 mm/hr — AB (ref 0–22)

## 2012-05-28 MED ORDER — KETOROLAC TROMETHAMINE 60 MG/2ML IM SOLN
60.0000 mg | Freq: Once | INTRAMUSCULAR | Status: AC
  Administered 2012-05-28: 60 mg via INTRAMUSCULAR

## 2012-05-28 NOTE — Telephone Encounter (Signed)
New Problem:    Patient called in because she had constant shooting pains in her head last night and wanted to consult you about it.  Please call back.

## 2012-05-28 NOTE — Progress Notes (Signed)
Subjective:    Patient ID: Shelby [name], female    DOB: 04-Sep-1940, 72 y.o.   MRN: [id number] Chief Complaint  Patient presents with  . Headache    stabing pain in head on left side x 2 weeks, progressively getting worse     HPI Haivng sharp pains in left head - started 2 wks ago and has been intermittent since then, but last night happened continuousy from 7 pm till 1 a.m. when she eventually fell asleep.  Then they started again this morning. Having deep burning behind eye. Eye feels sore this morning.  Not a normal pain - very sharp/shooting.  She has a distant h/o migraines treated with fiorinal but she rarely needs this and has not had a migraine in a very long time.  She has not tried the fiorinal in the past 2 wks for these new HAs.  With her migraines, her eye often feels sore and feels like burning behind it - has she is having now - and her temple on that side swells - which she also thinks may be happening.  However, the sharp pain shooting up her forehead and behind her eye into the temp is new.  No rash, no vision changes.   Has had migraines since she was 72 yo but has not had any in a very long time since she takes amitriptyline to prevent. Did have a left eye infection sev wks ago treated with tobramycine-dexamethasone by her optometrist Dr. Hyacinth Meeker.  Eyes itchy, red, and swollen after petting dog - which almost always happens after petting her dog a lot.  Last used eye drops yest morning.  Past Medical History  Diagnosis Date  . MVP (mitral valve prolapse)   . Migraine   . Eosinophilia   . Syncope     secondary to vasovagal phenomenon  . Hypercholesterolemia     statin intolerant  . Ovarian cyst   . Heart murmur     mitral valve prolapse  . Depression   . Breast cancer 2001    s/p surgery and chemo   . Colon cancer 2012   Current Outpatient Prescriptions on File Prior to Visit  Medication Sig Dispense Refill  . Calcium Carbonate-Vitamin D (CALCIUM 600 + D PO)  Take by mouth 2 (two) times daily.       . diazepam (VALIUM) 5 MG tablet Take 1 tablet (5 mg total) by mouth as needed.  30 tablet  5  . ELAVIL 50 MG tablet TAKE 1 OR 2 TABLETS AS DIRECTED.  60 tablet  3  . FIORINAL 50-325-40 MG per capsule TAKE 1 CAPSULE EVERY 4 HOURS AS NEEDED FOR HEADACHE.  30 capsule  1  . TENORMIN 25 MG tablet TAKE 1 OR 2 TABLETS DAILY  60 each  11  . meclizine (ANTIVERT) 25 MG tablet Take 1 tablet (25 mg total) by mouth 3 (three) times daily as needed for dizziness.  30 tablet  0  . metaxalone (SKELAXIN) 800 MG tablet Take 1 tablet (800 mg total) by mouth 2 (two) times daily as needed for pain.  60 tablet  5  . Naproxen Sodium (ALEVE PO) Take by mouth. As needed      . RENOVA 0.05 % CREA APPLY AT BEDTIME  46 g  9   No current facility-administered medications on file prior to visit.   Allergies  Allergen Reactions  . Latex Rash  . Benzonatate     REACTION: migraines  . Codeine  REACTION: Nausea  . Morphine And Related     itching  . Penicillins Rash  . Sulfonamide Derivatives Rash     Review of Systems  Constitutional: Positive for fatigue. Negative for fever, chills, diaphoresis, appetite change and unexpected weight change.  HENT: Negative for ear pain, congestion, sore throat, rhinorrhea, trouble swallowing, neck pain, neck stiffness, dental problem, voice change, sinus pressure and tinnitus.        No jaw claudication or fatigue.  Eyes: Positive for pain. Negative for photophobia, discharge, redness, itching and visual disturbance.  Gastrointestinal: Negative for nausea and vomiting.  Musculoskeletal: Negative for myalgias, back pain, joint swelling and arthralgias.  Skin: Negative for rash.  Neurological: Positive for headaches. Negative for dizziness, tremors, seizures, syncope, facial asymmetry, speech difficulty, weakness, light-headedness and numbness.  Psychiatric/Behavioral: Positive for sleep disturbance.      BP 122/86  Pulse 75   Temp(Src) 97.6 F (36.4 C) (Oral)  Resp 16  Ht 5\' 3"  (1.6 m)  Wt 136 lb 6.4 oz (61.871 kg)  BMI 24.17 kg/m2  SpO2 98% Objective:   Physical Exam  Constitutional: She is oriented to person, place, and time. She appears well-developed and well-nourished.  Sitting up on exam table in bright room in no discomfort, quite pleasant, very well groomed, no photophobia.  HENT:  Head: Normocephalic and atraumatic.  Right Ear: Tympanic membrane, external ear and ear canal normal.  Left Ear: Tympanic membrane, external ear and ear canal normal.  Nose: Nose normal. Right sinus exhibits no maxillary sinus tenderness. Left sinus exhibits no maxillary sinus tenderness.  Mouth/Throat: Uvula is midline, oropharynx is clear and moist and mucous membranes are normal. No oropharyngeal exudate.  Eyes: Conjunctivae, EOM and lids are normal. Pupils are equal, round, and reactive to light. Right eye exhibits no chemosis. Left eye exhibits no chemosis. Right eye exhibits normal extraocular motion and no nystagmus. Left eye exhibits normal extraocular motion and no nystagmus. Pupils are equal.  Fundoscopic exam:      The right eye shows no arteriolar narrowing, no AV nicking, no hemorrhage and no papilledema.       The left eye shows no arteriolar narrowing, no AV nicking, no hemorrhage and no papilledema.  Neck: Normal range of motion. Neck supple. No thyromegaly present.  Cardiovascular: Normal rate, regular rhythm, normal heart sounds and intact distal pulses.   Pulmonary/Chest: Effort normal and breath sounds normal. No respiratory distress.  Neurological: She is alert and oriented to person, place, and time. She has normal strength and normal reflexes. She displays normal reflexes. No cranial nerve deficit or sensory deficit. She exhibits normal muscle tone. She displays a negative Romberg sign. Coordination and gait normal.  Normal cerebellar RAM, heal-to-shin, finger-to-nose, no pronator drift.  Skin: Skin is  warm and dry. She is not diaphoretic.  Psychiatric: She has a normal mood and affect. Her behavior is normal.  Vision Rt eye 20/50, Lt eye 20/50, both eyes 20/30 - pt has her bifocal contacts in    Results for orders placed in visit on 05/28/12  POCT CBC      Result Value Range   WBC 5.0  4.6 - 10.2 K/uL   Lymph, poc 2.2  0.6 - 3.4   POC LYMPH PERCENT 43.2  10 - 50 %L   MID (cbc) 0.5  0 - 0.9   POC MID % 10.5  0 - 12 %M   POC Granulocyte 2.3  2 - 6.9   Granulocyte percent 46.3  37 -  80 %G   RBC 5.07  4.04 - 5.48 M/uL   Hemoglobin 14.9  12.2 - 16.2 g/dL   HCT, POC 16.1  09.6 - 47.9 %   MCV 92.7  80 - 97 fL   MCH, POC 29.4  27 - 31.2 pg   MCHC 31.7 (*) 31.8 - 35.4 g/dL   RDW, POC 04.5     Platelet Count, POC 193  142 - 424 K/uL   MPV 10.3  0 - 99.8 fL  POCT SEDIMENTATION RATE      Result Value Range   POCT SED RATE 32 (*) 0 - 22 mm/hr    Assessment & Plan:  Worsening headaches - Plan: POCT CBC, POCT SEDIMENTATION RATE, ketorolac (TORADOL) injection 60 mg  Meds ordered this encounter  Medications  . ketorolac (TORADOL) injection 60 mg    Sig:      Exam is reassuringly normal and she is not having any other sxs that would be concerning for cerebral hemorrhage, acute glaucoma, temporal arteritis, or other. Her vision and fundoscopic exam is the same bilaterally which is reassuring that this is not likely an acute opthomalogic process.  Unlikely to be zostor as she has been having pain for 2 wks w/o rash. Therefore will go ahead and treat for migraine headache.  Pt given toradol in office and instructed to start taking her fiorinal which she has with her.  If this does not help or pain recurs, consider stat head ct and rec pt be referred to neurology and opthamology urgently for further eval.  Checked in w/ pt by phone approx 2-3 hrs after toradol was given in office - she has not had any headache pain since and has taken 2 fiorinal.

## 2012-05-28 NOTE — Telephone Encounter (Signed)
Patient states she has had shooting pains in her head off and on for about a week. Last night she had these shooting pains constant from about 7 pm to 1 am. This am she feels her pupils are dilated. Discussed with  Dr. Patty Sermons and advised patient to go to urgent care, verbalized understanding.

## 2012-06-05 ENCOUNTER — Telehealth: Payer: Self-pay

## 2012-06-05 NOTE — Telephone Encounter (Signed)
PT WAS TOLD TO CALL DR. SHAW BACK LAST WEEK IF SHE DID NOT FEEL ANY BETTER.  SHE SAID SHE HAS CALLED BUT HAS NOT HEARD FROM [place].  (I DO NOT SEE A PHONE MESSAGE PUT IN AFTER HER LAST APPOINTMENT).  SAYS SHE IS MUCH WORSE WITH THE HEADACHE PAIN.  SAID IT GOT SO BAD ONE DAY THAT SHE WAS THROWING UP.  PLEASE CALL ASAP [phone]

## 2012-06-07 ENCOUNTER — Ambulatory Visit (INDEPENDENT_AMBULATORY_CARE_PROVIDER_SITE_OTHER): Payer: Medicare Other | Admitting: Family Medicine

## 2012-06-07 ENCOUNTER — Encounter (HOSPITAL_COMMUNITY): Payer: Self-pay | Admitting: *Deleted

## 2012-06-07 ENCOUNTER — Emergency Department (HOSPITAL_COMMUNITY): Payer: Medicare Other

## 2012-06-07 ENCOUNTER — Telehealth: Payer: Self-pay | Admitting: Family Medicine

## 2012-06-07 ENCOUNTER — Emergency Department (HOSPITAL_COMMUNITY)
Admission: EM | Admit: 2012-06-07 | Discharge: 2012-06-07 | Disposition: A | Discharge status: Home / Self Care | Payer: Medicare Other | Attending: Emergency Medicine | Admitting: Emergency Medicine

## 2012-06-07 VITALS — BP 128/80 | HR 67 | Temp 98.0°F | Resp 16 | Ht 63.0 in | Wt 133.0 lb

## 2012-06-07 DIAGNOSIS — R51 Headache: Secondary | ICD-10-CM | POA: Insufficient documentation

## 2012-06-07 DIAGNOSIS — Z79899 Other long term (current) drug therapy: Secondary | ICD-10-CM | POA: Insufficient documentation

## 2012-06-07 DIAGNOSIS — Z862 Personal history of diseases of the blood and blood-forming organs and certain disorders involving the immune mechanism: Secondary | ICD-10-CM | POA: Insufficient documentation

## 2012-06-07 DIAGNOSIS — Z85038 Personal history of other malignant neoplasm of large intestine: Secondary | ICD-10-CM | POA: Insufficient documentation

## 2012-06-07 DIAGNOSIS — Z8679 Personal history of other diseases of the circulatory system: Secondary | ICD-10-CM | POA: Insufficient documentation

## 2012-06-07 DIAGNOSIS — J45909 Unspecified asthma, uncomplicated: Secondary | ICD-10-CM | POA: Insufficient documentation

## 2012-06-07 DIAGNOSIS — Z8639 Personal history of other endocrine, nutritional and metabolic disease: Secondary | ICD-10-CM | POA: Insufficient documentation

## 2012-06-07 DIAGNOSIS — Z8742 Personal history of other diseases of the female genital tract: Secondary | ICD-10-CM | POA: Insufficient documentation

## 2012-06-07 DIAGNOSIS — Z853 Personal history of malignant neoplasm of breast: Secondary | ICD-10-CM | POA: Insufficient documentation

## 2012-06-07 DIAGNOSIS — R011 Cardiac murmur, unspecified: Secondary | ICD-10-CM | POA: Insufficient documentation

## 2012-06-07 LAB — POCT CBC
MCH, POC: 29.7 pg (ref 27–31.2)
MCHC: 32.4 g/dL (ref 31.8–35.4)
MCV: 91.5 fL (ref 80–97)
MID (cbc): 0.5 (ref 0–0.9)
MPV: 10.8 fL (ref 0–99.8)
POC LYMPH PERCENT: 48 %L (ref 10–50)
POC MID %: 10.6 %M (ref 0–12)
Platelet Count, POC: 214 10*3/uL (ref 142–424)
RDW, POC: 12.9 %
WBC: 4.9 10*3/uL (ref 4.6–10.2)

## 2012-06-07 MED ORDER — METOCLOPRAMIDE HCL 5 MG/ML IJ SOLN
5.0000 mg | Freq: Once | INTRAMUSCULAR | Status: AC
  Administered 2012-06-07: 5 mg via INTRAVENOUS
  Filled 2012-06-07: qty 2

## 2012-06-07 MED ORDER — PREDNISONE 20 MG PO TABS
60.0000 mg | ORAL_TABLET | Freq: Once | ORAL | Status: AC
  Administered 2012-06-07: 60 mg via ORAL
  Filled 2012-06-07: qty 3

## 2012-06-07 NOTE — Telephone Encounter (Signed)
LMOM to RTC to assess. Pt is here already.

## 2012-06-07 NOTE — Telephone Encounter (Signed)
LVM for patient asking how she was feeling and what she perceived the outcome of her ER visit to be.  It looks like no opthalmologic abnormality was found and a head CT scan was normal.  I asked patient to call back so we can decide further treatment plans. May want to consider starting prednisone and referring to neurology for further evaluation of atypical headaches and eye pain.

## 2012-06-07 NOTE — ED Notes (Signed)
Pt reports having a headache x 2 weeks, has sharp stabbing pains down through the top of her head, has begun feeling like left eye "is bigger than the right." [facility] and sent here to get MRI. No acute distress noted at triage.

## 2012-06-07 NOTE — ED Provider Notes (Signed)
History     CSN: [id number]  Arrival date & time 06/07/12  1047   First MD Initiated Contact with Patient 06/07/12 1215      Chief Complaint  Patient presents with  . Headache    (Consider location/radiation/quality/duration/timing/severity/associated sxs/prior treatment) HPI Complains of headache at the left top of her head onset gradually one month ago accompanied by his shooting pains to the left temporal area which lasts a split second. She is treated herself with urine all with partial relief headache is 5 on a scale of 1-10 presently headache is throbbing quality not made worse or better by anything not visual changes. She was seen at [facility] urgent care this morning, off on contact made with Dr. Randon Goldsmith who requested patient be sent here for further evaluation. She denies visual changes denies fever denies eye redness though her son reports her eye was red several days ago. No nausea or vomiting for the past week. She did report vomiting diarrhea 1 week ago. Past Medical History  Diagnosis Date  . MVP (mitral valve prolapse)   . Migraine   . Eosinophilia   . Syncope     secondary to vasovagal phenomenon  . Hypercholesterolemia     statin intolerant  . Ovarian cyst   . Heart murmur     mitral valve prolapse  . Depression   . Breast cancer 2001    s/p surgery and chemo   . Colon cancer 2012  . Allergy   . Arthritis   . Asthma    fibromyalgia  Past Surgical History  Procedure Laterality Date  . Mastectomy  2001  . Colonscopy    . Back surgery  1191,4782  . Shoulder surgery  2000  . Spine surgery    . Cosmetic surgery      face lift  . Breast surgery      2000-mastectomy and TRAM with mesh  . Vaginal hysterectomy  1979  . Appendectomy  1970  . Laparoscopic salpingoopherectomy  8.29.12    LAP.W/BSO    . Colon surgery      Family History  Problem Relation Age of Onset  . Arthritis Mother   . Heart disease Father   . Heart attack Father   . Hypertension  Father   . Cancer Son     rectal cancer-died age 73  . Diabetes Paternal Aunt   . Breast cancer Paternal Grandfather     History  Substance Use Topics  . Smoking status: Never Smoker   . Smokeless tobacco: Never Used  . Alcohol Use: 0.6 oz/week    1 Glasses of wine per week    OB History   Grav Para Term Preterm Abortions TAB SAB Ect Mult Living                  Review of Systems  Neurological: Positive for headaches.  All other systems reviewed and are negative.    Allergies  Latex; Benzonatate; Codeine; Morphine and related; Skelaxin; Penicillins; and Sulfonamide derivatives  Home Medications   Current Outpatient Rx  Name  Route  Sig  Dispense  Refill  . amitriptyline (ELAVIL) 50 MG tablet   Oral   Take 50-100 mg by mouth at bedtime as needed for sleep (for sleep).         Marland Kitchen atenolol (TENORMIN) 25 MG tablet   Oral   Take 25-50 mg by mouth daily as needed (takes 25 mg daily but will take 50 mg if heart is racing).         Marland Kitchen  butalbital-aspirin-caffeine (FIORINAL) 50-325-40 MG per capsule   Oral   Take 1 capsule by mouth every 4 (four) hours as needed for headache.         . Calcium Carbonate-Vitamin D (CALCIUM 600 + D PO)   Oral   Take 1 tablet by mouth 2 (two) times daily.          . diazepam (VALIUM) 5 MG tablet   Oral   Take 5 mg by mouth as needed (for pain).         . Tretinoin, Facial Wrinkles, (RENOVA EX)   Apply externally   Apply 1 application topically at bedtime.           BP 138/59  Pulse 65  Temp(Src) 97.4 F (36.3 C) (Oral)  Resp 15  SpO2 100%  Physical Exam  Nursing note and vitals reviewed. Constitutional: She appears well-developed and well-nourished.  HENT:  Head: Normocephalic and atraumatic.  Minimally tender at left temporal area no redness  Eyes: Conjunctivae are normal. Pupils are equal, round, and reactive to light.  Eyes not red. corneas nonopacified. No anisocoria  Neck: Neck supple. No tracheal deviation  present. No thyromegaly present.  No bruit  Cardiovascular: Normal rate and regular rhythm.   No murmur heard. Pulmonary/Chest: Effort normal and breath sounds normal.  Abdominal: Soft. Bowel sounds are normal. She exhibits no distension. There is no tenderness.  Musculoskeletal: Normal range of motion. She exhibits no edema and no tenderness.  Neurological: She is alert. She has normal reflexes. Coordination normal.  Gait normal Romberg normal pronator drift normal  Skin: Skin is warm and dry. No rash noted.  Psychiatric: She has a normal mood and affect.    ED Course  Procedures (including critical care time)  Labs Reviewed - No data to display No results found. 1:40 PM pain improved after treatment with intravenous Reglan patient alert Glasgow Coma Score 15  No diagnosis found.    MDM  I do not see any evidence of acute narrow angle glaucoma. Dr. Randon Goldsmith will evaluate patient in the emergency department following a noncontrasted CT scan of her head. Patient had sedimentation rate this morning of 38 which per Dr. Randon Goldsmith is essentially top normal for her age. Dr. Randon Goldsmith spoke with me following his examination here he is remotely concerned with temporal arteritis.. I have contacted [facility] Dr.Cornett, who will contact the office to arrange for temporal artery biopsy as outpatient. Prescription prednisone 60 mg daily for 9 days starting tomorrow. Prednisone 60 mg prior to discharge by mouth Patient is to contact Dr. Clelia Croft for followup next week, and to followup with Dr.Lyles following temporal artery biopsy Diagnosis headache  Differential diagnosis includes temporal arteritis, migraine, nonspecific        Doug Sou, MD 06/07/12 1439

## 2012-06-07 NOTE — Progress Notes (Signed)
Subjective:    Patient ID: Shelby [name], female    DOB: 08/20/1940, 72 y.o.   MRN: [id number] Chief Complaint  Patient presents with  . Headache   HPI  Has had a progressively worsening headache over the past month. Has episodes of sharp stabbing pain shooting down through the center of her head getting worse and now has constant occipital throbbing as well.  Left eye feels swollen - and headache is constant radiating to the back of her head and neck.  When she wakes up in the morning it is the best but gets progressively worse throughout the day. Over the past 10days it has been getting progressively worse.  Right now her left eye is feels twice the size of the right - a ton of pressure in the eye. Eye aches and feels hot. No vision changes or blurred vision. No jaw fatigue.  No dizziness.  Ears feel like they need to pop.   1 wk ago she developed an acute GI illness. She had vomiting and diarrhea for 3d - Mon threw Wed.  On Tues she started to be able to eat a little.  No fevers or abd pain. Is aching and feels hot behind it.   She was at the beach but had to come home yesterday as she was so ill.  Has a h/o of eosinophilia.  Had asthma as a child.  Past Medical History  Diagnosis Date  . MVP (mitral valve prolapse)   . Migraine   . Eosinophilia   . Syncope     secondary to vasovagal phenomenon  . Hypercholesterolemia     statin intolerant  . Ovarian cyst   . Heart murmur     mitral valve prolapse  . Depression   . Breast cancer 2001    s/p surgery and chemo   . Colon cancer 2012  . Allergy   . Arthritis   . Asthma    Current Outpatient Prescriptions on File Prior to Visit  Medication Sig Dispense Refill  . Calcium Carbonate-Vitamin D (CALCIUM 600 + D PO) Take by mouth 2 (two) times daily.       . diazepam (VALIUM) 5 MG tablet Take 1 tablet (5 mg total) by mouth as needed.  30 tablet  5  . ELAVIL 50 MG tablet TAKE 1 OR 2 TABLETS AS DIRECTED.  60 tablet  3  . FIORINAL  50-325-40 MG per capsule TAKE 1 CAPSULE EVERY 4 HOURS AS NEEDED FOR HEADACHE.  30 capsule  1  . TENORMIN 25 MG tablet TAKE 1 OR 2 TABLETS DAILY  60 each  11  . meclizine (ANTIVERT) 25 MG tablet Take 1 tablet (25 mg total) by mouth 3 (three) times daily as needed for dizziness.  30 tablet  0  . metaxalone (SKELAXIN) 800 MG tablet Take 1 tablet (800 mg total) by mouth 2 (two) times daily as needed for pain.  60 tablet  5  . Naproxen Sodium (ALEVE PO) Take by mouth. As needed      . RENOVA 0.05 % CREA APPLY AT BEDTIME  46 g  9   No current facility-administered medications on file prior to visit.    Review of Systems    BP 128/80  Pulse 67  Temp(Src) 98 F (36.7 C) (Oral)  Resp 16  Ht 5\' 3"  (1.6 m)  Wt 133 lb (60.328 kg)  BMI 23.57 kg/m2  SpO2 98% Objective:   Physical Exam  Constitutional: She is oriented to person,  place, and time. She appears well-developed and well-nourished. No distress.  HENT:  Head: Normocephalic and atraumatic. No trismus in the jaw.  Right Ear: External ear normal. Tympanic membrane is injected and retracted.  Left Ear: Tympanic membrane is injected and retracted.  Nose: Mucosal edema present. No rhinorrhea.  Mouth/Throat: Uvula is midline, oropharynx is clear and moist and mucous membranes are normal. No edematous.  Eyes: Conjunctivae are normal. No scleral icterus. Right pupil is round and reactive. Left pupil is round and reactive. Pupils are unequal.  Left pupil is larger than her right.  Fundoscopic and vision exam seems otherwise unremarkable but she has severe pain with globe pressure.  Pulmonary/Chest: Effort normal.  Neurological: She is alert and oriented to person, place, and time. She exhibits normal muscle tone.  Skin: Skin is warm and dry. She is not diaphoretic. No erythema.  Psychiatric: She has a normal mood and affect. Her behavior is normal.      Results for orders placed in visit on 06/07/12  POCT CBC      Result Value Range   WBC  4.9  4.6 - 10.2 K/uL   Lymph, poc 2.4  0.6 - 3.4   POC LYMPH PERCENT 48.0  10 - 50 %L   MID (cbc) 0.5  0 - 0.9   POC MID % 10.6  0 - 12 %M   POC Granulocyte 2.0  2 - 6.9   Granulocyte percent 41.4  37 - 80 %G   RBC 4.82  4.04 - 5.48 M/uL   Hemoglobin 14.3  12.2 - 16.2 g/dL   HCT, POC 16.1  09.6 - 47.9 %   MCV 91.5  80 - 97 fL   MCH, POC 29.7  27 - 31.2 pg   MCHC 32.4  31.8 - 35.4 g/dL   RDW, POC 04.5     Platelet Count, POC 214  142 - 424 K/uL   MPV 10.8  0 - 99.8 fL  POCT SEDIMENTATION RATE      Result Value Range   POCT SED RATE 38 (*) 0 - 22 mm/hr     Assessment & Plan:  Headache - Plan: POCT CBC, POCT SEDIMENTATION RATE - with new anisocoria and globus pressure. Ms. Ranker needs emergent opthomalogic eval. I am worried about acute glaucoma or iritis vs temporal arteritis but I would expect her ESR to be higher.  Case discussed with Dr. Randon Goldsmith of [facility] who will evaluate the patient in the ER after she has an MRI/MRA of her brain and orbits.  If imaing and opthalmologic eval does not reveal cause, we might put the patient on high dose prednisone and consider stat rheum referral.

## 2012-06-07 NOTE — Patient Instructions (Addendum)
Discussed with opthalmology on call from [facility] - Dr. Randon Goldsmith  - who would like to evaluate Shelby [name] in the ER after she has an MRI/MRA of the brain and orbits.  Will send patient to the ER.  Discussed case with ER physician to let him know plan.

## 2012-06-07 NOTE — Consult Note (Signed)
Reason for consult:  HPI:  Illana Nolting [name] is an 72 y.o. female.  Who we are asked to evaluate further for episodic eye pain OS in the setting of headache.  The patient reports having intermittent L sided headache for > 1 month.  It radiates to a retrobulbar location intermittently.   It is episodic, sharp and burning in quality.  It is located along the L temporal scalp and brow.  She had her hair washed at an appt last week and noted very sharp, burning pain when touched over the temporal scalp.     She denies vision changes.  She denies jaw pain, tongue claudication.     Past Medical History  Diagnosis Date  . MVP (mitral valve prolapse)   . Migraine   . Eosinophilia   . Syncope     secondary to vasovagal phenomenon  . Hypercholesterolemia     statin intolerant  . Ovarian cyst   . Heart murmur     mitral valve prolapse  . Depression   . Breast cancer 2001    s/p surgery and chemo   . Colon cancer 2012  . Allergy   . Arthritis   . Asthma    Past Surgical History  Procedure Laterality Date  . Mastectomy  2001  . Colonscopy    . Back surgery  9604,5409  . Shoulder surgery  2000  . Spine surgery    . Cosmetic surgery      face lift  . Breast surgery      2000-mastectomy and TRAM with mesh  . Vaginal hysterectomy  1979  . Appendectomy  1970  . Laparoscopic salpingoopherectomy  8.29.12    LAP.W/BSO    . Colon surgery     Family History  Problem Relation Age of Onset  . Arthritis Mother   . Heart disease Father   . Heart attack Father   . Hypertension Father   . Cancer Son     rectal cancer-died age 65  . Diabetes Paternal Aunt   . Breast cancer Paternal Grandfather    No current facility-administered medications for this encounter.   Current Outpatient Prescriptions  Medication Sig Dispense Refill  . amitriptyline (ELAVIL) 50 MG tablet Take 50-100 mg by mouth at bedtime as needed for sleep (for sleep).      Marland Kitchen atenolol (TENORMIN) 25 MG tablet Take 25-50  mg by mouth daily as needed (takes 25 mg daily but will take 50 mg if heart is racing).      . butalbital-aspirin-caffeine (FIORINAL) 50-325-40 MG per capsule Take 1 capsule by mouth every 4 (four) hours as needed for headache.      . Calcium Carbonate-Vitamin D (CALCIUM 600 + D PO) Take 1 tablet by mouth 2 (two) times daily.       . diazepam (VALIUM) 5 MG tablet Take 5 mg by mouth as needed (for pain).      . Tretinoin, Facial Wrinkles, (RENOVA EX) Apply 1 application topically at bedtime.       Allergies  Allergen Reactions  . Latex Rash  . Benzonatate     REACTION: migraines  . Codeine     REACTION: Nausea  . Morphine And Related     itching  . Skelaxin (Metaxalone) Other (See Comments)    "Makes Pt feel crazy"  . Penicillins Rash  . Sulfonamide Derivatives Rash   History   Social History  . Marital Status: Married    Spouse Name: N/A    Number  of Children: N/A  . Years of Education: N/A   Occupational History  . Not on file.   Social History Main Topics  . Smoking status: Never Smoker   . Smokeless tobacco: Never Used  . Alcohol Use: 0.6 oz/week    1 Glasses of wine per week  . Drug Use: No  . Sexually Active: Not on file   Other Topics Concern  . Not on file   Social History Narrative  . No narrative on file    Review of systems: As per HPI, and PMH.   Physical Exam:  Blood pressure 138/59, pulse 65, temperature 97.4 F (36.3 C), temperature source Oral, resp. rate 15, SpO2 100.00%.   VA cc (OTC rdrs):  OD 20/25-   OS  20/25-  Pupils:   OD round, reactive to light, no APD            OS round, reactive to light, no APD  ;   OS > OD, by 1mm in Light and Dark.   IOP (T pen)  OD 14  OS  15  CVF: OD full to CF   OS full to CF  Motility:  OD full ductions  OS full ductions  Balance/alignment:  Ortho by Phoebe Perch   Slit lamp examination:                                 OD                                       External/adnexa: Normal                                       Lids/lashes:        Normal                                      Conjunctiva        White, quiet        Cornea:              Clear                  AC:                     Deep, quiet                                Iris:                     Normal        Lens:                  Clear                                       OS  External/adnexa: Normal                                      Lids/lashes:        Normal                                      Conjunctiva        White, quiet        Cornea:              Clear                  AC:                     Deep, quiet                                Iris:                     Normal        Lens:                  Clear       Dilated fundus exam: (Neo 2.5; Myd 1%)      OD Vitreous            Clear, quiet                                Optic Disc:       Normal, perfused                      Macula:             Flat                                            Vessels:           Normal caliber,distribution         Periphery:         Flat, attached                                      OS Vitreous            Clear, quiet                                Optic Disc:       Normal, perfused                      Macula:             Flat                                            Vessels:  Normal caliber,distribution         Periphery:         Flat, attached        Labs/studies: Results for orders placed in visit on 06/07/12 (from the past 48 hour(s))  POCT CBC     Status: None   Collection Time    06/07/12  8:30 AM      Result Value Range   WBC 4.9  4.6 - 10.2 K/uL   Lymph, poc 2.4  0.6 - 3.4   POC LYMPH PERCENT 48.0  10 - 50 %L   MID (cbc) 0.5  0 - 0.9   POC MID % 10.6  0 - 12 %M   POC Granulocyte 2.0  2 - 6.9   Granulocyte percent 41.4  37 - 80 %G   RBC 4.82  4.04 - 5.48 M/uL   Hemoglobin 14.3  12.2 - 16.2 g/dL   HCT, POC 78.2  95.6 - 47.9 %   MCV 91.5  80 - 97 fL    MCH, POC 29.7  27 - 31.2 pg   MCHC 32.4  31.8 - 35.4 g/dL   RDW, POC 21.3     Platelet Count, POC 214  142 - 424 K/uL   MPV 10.8  0 - 99.8 fL  POCT SEDIMENTATION RATE     Status: Abnormal   Collection Time    06/07/12  9:30 AM      Result Value Range   POCT SED RATE 38 (*) 0 - 22 mm/hr   No results found.                           Assessment and Plan:  72 y.o. Female who we are asked to evaluate further for episodic eye pain OS in the setting of temporal headache for > 1 month.  It is episodic, sharp and burning in quality.  It is located along the L temporal scalp and brow.  She had her hair washed at an appt last week and noted very sharp, burning pain when touched over the temporal scalp.  The patient has a normal ophthalmic and neuroophthalmic examination.    The patient's ESR is 38 (which can be interpreted as normal based on age adjustment for females: age + 76 / 2).  However, the history that she provides in a very clear and detailed manner certainly raises the concern for GCA / TA.    Recommendations:   -- Check CRP  -- Consult surg or vasc surg to arrange for TA biopsy ASAP  -- Pred 60 mg PO Q day.   -- Ranitidine 150mg  PO BID    Follow up with Dr. Clelia Croft this week for continued pred dosing and monitoring.   Follow up with me in 1-2 weeks.    All of the above information was relayed to the patient and/or patient family.  Ophthalmic warning signs and symptoms were reviewed, and clear instructions for immediate phone contact and/or immediate return to the ED or clinic were provided should any of these signs or symptoms occur.  Follow up contact information was provided.  All questions were answered.   Antony Contras 06/07/2012, 1:36 PM  [facility] [phone]

## 2012-06-07 NOTE — ED Notes (Signed)
Pt c/o HA x 2 weeks, pt reports she has been getting sharp shooting pains a few times a day and sts that her left eye has been sore. Pt coming from urgent care and sts they sent her to get an MRI/MRA. Pt in nad, no facial droop, equal hand grips, ambulatory to room.

## 2012-06-07 NOTE — Telephone Encounter (Signed)
That is concerning.  She needs to come back in for further evaluation or to the ER so we can repeat her exam and lab test and she may need imaging.

## 2012-06-08 ENCOUNTER — Encounter (HOSPITAL_COMMUNITY): Payer: Self-pay | Admitting: Pharmacy Technician

## 2012-06-08 ENCOUNTER — Telehealth (INDEPENDENT_AMBULATORY_CARE_PROVIDER_SITE_OTHER): Payer: Self-pay | Admitting: *Deleted

## 2012-06-08 ENCOUNTER — Encounter (INDEPENDENT_AMBULATORY_CARE_PROVIDER_SITE_OTHER): Payer: Self-pay | Admitting: Surgery

## 2012-06-08 ENCOUNTER — Ambulatory Visit (INDEPENDENT_AMBULATORY_CARE_PROVIDER_SITE_OTHER): Payer: Medicare Other | Admitting: Surgery

## 2012-06-08 VITALS — BP 124/70 | HR 72 | Temp 96.9°F | Resp 14 | Ht 63.5 in | Wt 132.0 lb

## 2012-06-08 DIAGNOSIS — R51 Headache: Secondary | ICD-10-CM

## 2012-06-08 NOTE — Telephone Encounter (Signed)
Received message from Cornett MD to schedule patient ASAP for temporal artery biopsy.  Patient scheduled for this afternoon at 250p with Magnus Ivan MD.  Patient agreeable at this time.

## 2012-06-08 NOTE — Progress Notes (Signed)
Patient ID: Shelby [name], female   DOB: 1940-07-08, 72 y.o.   MRN: [id number]  Chief Complaint  Patient presents with  . Other    headache    HPI Shelby [name] is a 72 y.o. female.   HPI She is referred by Dr. Randon Goldsmith for evaluation of left-sided headaches and need for temporal artery biopsy. She is now on steroids. This is been occurring over a month. She recently had ophthalmology evaluate her and this was negative as well. She has had no discomfort on the right side and no problems with her eye on the right. She is otherwise currently without complaints Past Medical History  Diagnosis Date  . MVP (mitral valve prolapse)   . Migraine   . Eosinophilia   . Syncope     secondary to vasovagal phenomenon  . Hypercholesterolemia     statin intolerant  . Ovarian cyst   . Heart murmur     mitral valve prolapse  . Depression   . Breast cancer 2001    s/p surgery and chemo   . Colon cancer 2012  . Allergy   . Arthritis   . Asthma     Past Surgical History  Procedure Laterality Date  . Mastectomy  2001  . Colonscopy    . Back surgery  [id number]  . Shoulder surgery  2000  . Spine surgery    . Cosmetic surgery      face lift  . Breast surgery      2000-mastectomy and TRAM with mesh  . Vaginal hysterectomy  1979  . Appendectomy  1970  . Laparoscopic salpingoopherectomy  8.29.12    LAP.W/BSO    . Colon surgery      Family History  Problem Relation Age of Onset  . Arthritis Mother   . Heart disease Father   . Heart attack Father   . Hypertension Father   . Cancer Son     rectal cancer-died age 39  . Diabetes Paternal Aunt   . Breast cancer Paternal Grandfather     Social History History  Substance Use Topics  . Smoking status: Never Smoker   . Smokeless tobacco: Never Used  . Alcohol Use: 0.6 oz/week    1 Glasses of wine per week    Allergies  Allergen Reactions  . Latex Rash  . Benzonatate     REACTION: migraines  . Codeine     REACTION: Nausea   . Morphine And Related     itching  . Skelaxin (Metaxalone) Other (See Comments)    "Makes Pt feel crazy"  . Penicillins Rash  . Sulfonamide Derivatives Rash    Current Outpatient Prescriptions  Medication Sig Dispense Refill  . amitriptyline (ELAVIL) 50 MG tablet Take 50-100 mg by mouth at bedtime as needed for sleep (for sleep).      Marland Kitchen atenolol (TENORMIN) 25 MG tablet Take 25-50 mg by mouth daily as needed (takes 25 mg daily but will take 50 mg if heart is racing).      . butalbital-aspirin-caffeine (FIORINAL) 50-325-40 MG per capsule Take 1 capsule by mouth every 4 (four) hours as needed for headache.      . Calcium Carbonate-Vitamin D (CALCIUM 600 + D PO) Take 1 tablet by mouth 2 (two) times daily.       . diazepam (VALIUM) 5 MG tablet Take 5 mg by mouth as needed (for pain).      . Tretinoin, Facial Wrinkles, (RENOVA EX) Apply 1 application topically at  bedtime.       No current facility-administered medications for this visit.    Review of Systems Review of Systems  Respiratory: Negative for chest tightness and shortness of breath.   Cardiovascular: Negative for chest pain.  Neurological: Positive for headaches.    Blood pressure 124/70, pulse 72, temperature 96.9 F (36.1 C), temperature source Temporal, resp. rate 14, height 5' 3.5" (1.613 m), weight 132 lb (59.875 kg).  Physical Exam Physical Exam  Constitutional: She is oriented to person, place, and time. She appears well-developed and well-nourished. No distress.  Eyes: Conjunctivae are normal. Pupils are equal, round, and reactive to light. Right eye exhibits no discharge. Left eye exhibits no discharge. No scleral icterus.  Neck: Normal range of motion. Neck supple.  Cardiovascular: Normal rate, regular rhythm and intact distal pulses.   Murmur heard. Pulmonary/Chest: Effort normal and breath sounds normal. No respiratory distress. She has no wheezes. She has no rales.  Neurological: She is alert and oriented to  person, place, and time.  Skin: Skin is warm and dry. She is not diaphoretic.  Psychiatric: Her behavior is normal. Judgment normal.    Data Reviewed   Assessment    Left-sided headache, possible temporal arteritis     Plan    At her physician's request, she will be scheduled for a temporal artery biopsy. I explained the risk of the surgery which includes but is not limited to bleeding, infection, and the biopsy being nondiagnostic. Surgery will be scheduled urgently        Oniyah Rohe A 06/08/2012, 2:58 PM

## 2012-06-09 ENCOUNTER — Encounter (HOSPITAL_COMMUNITY): Payer: Self-pay | Admitting: *Deleted

## 2012-06-09 MED ORDER — MUPIROCIN 2 % EX OINT
TOPICAL_OINTMENT | Freq: Once | CUTANEOUS | Status: AC
  Administered 2012-06-10: 1 via NASAL
  Filled 2012-06-09: qty 22

## 2012-06-09 NOTE — H&P (Signed)
Patient ID: Shelby [name], female DOB: 11-Apr-1940, 72 y.o. MRN: [id number]  Chief Complaint   Patient presents with   .  Other     headache   HPI  Shelby [name] is a 72 y.o. female.  HPI  She is referred by Dr. Randon Goldsmith for evaluation of left-sided headaches and need for temporal artery biopsy. She is now on steroids. This is been occurring over a month. She recently had ophthalmology evaluate her and this was negative as well. She has had no discomfort on the right side and no problems with her eye on the right. She is otherwise currently without complaints  Past Medical History   Diagnosis  Date   .  MVP (mitral valve prolapse)    .  Migraine    .  Eosinophilia    .  Syncope      secondary to vasovagal phenomenon   .  Hypercholesterolemia      statin intolerant   .  Ovarian cyst    .  Heart murmur      mitral valve prolapse   .  Depression    .  Breast cancer  2001     s/p surgery and chemo   .  Colon cancer  2012   .  Allergy    .  Arthritis    .  Asthma     Past Surgical History   Procedure  Laterality  Date   .  Mastectomy   2001   .  Colonscopy     .  Back surgery   8413,2440   .  Shoulder surgery   2000   .  Spine surgery     .  Cosmetic surgery       face lift   .  Breast surgery       2000-mastectomy and TRAM with mesh   .  Vaginal hysterectomy   1979   .  Appendectomy   1970   .  Laparoscopic salpingoopherectomy   8.29.12     LAP.W/BSO   .  Colon surgery      Family History   Problem  Relation  Age of Onset   .  Arthritis  Mother    .  Heart disease  Father    .  Heart attack  Father    .  Hypertension  Father    .  Cancer  Son      rectal cancer-died age 32   .  Diabetes  Paternal Aunt    .  Breast cancer  Paternal Grandfather    Social History  History   Substance Use Topics   .  Smoking status:  Never Smoker   .  Smokeless tobacco:  Never Used   .  Alcohol Use:  0.6 oz/week     1 Glasses of wine per week    Allergies   Allergen   Reactions   .  Latex  Rash   .  Benzonatate      REACTION: migraines   .  Codeine      REACTION: Nausea   .  Morphine And Related      itching   .  Skelaxin (Metaxalone)  Other (See Comments)     "Makes Pt feel crazy"   .  Penicillins  Rash   .  Sulfonamide Derivatives  Rash    Current Outpatient Prescriptions   Medication  Sig  Dispense  Refill   .  amitriptyline (ELAVIL) 50 MG  tablet  Take 50-100 mg by mouth at bedtime as needed for sleep (for sleep).     Marland Kitchen  atenolol (TENORMIN) 25 MG tablet  Take 25-50 mg by mouth daily as needed (takes 25 mg daily but will take 50 mg if heart is racing).     .  butalbital-aspirin-caffeine (FIORINAL) 50-325-40 MG per capsule  Take 1 capsule by mouth every 4 (four) hours as needed for headache.     .  Calcium Carbonate-Vitamin D (CALCIUM 600 + D PO)  Take 1 tablet by mouth 2 (two) times daily.     .  diazepam (VALIUM) 5 MG tablet  Take 5 mg by mouth as needed (for pain).     .  Tretinoin, Facial Wrinkles, (RENOVA EX)  Apply 1 application topically at bedtime.      No current facility-administered medications for this visit.   Review of Systems  Review of Systems  Respiratory: Negative for chest tightness and shortness of breath.  Cardiovascular: Negative for chest pain.  Neurological: Positive for headaches.  Blood pressure 124/70, pulse 72, temperature 96.9 F (36.1 C), temperature source Temporal, resp. rate 14, height 5' 3.5" (1.613 m), weight 132 lb (59.875 kg).  Physical Exam  Physical Exam  Constitutional: She is oriented to person, place, and time. She appears well-developed and well-nourished. No distress.  Eyes: Conjunctivae are normal. Pupils are equal, round, and reactive to light. Right eye exhibits no discharge. Left eye exhibits no discharge. No scleral icterus.  Neck: Normal range of motion. Neck supple.  Cardiovascular: Normal rate, regular rhythm and intact distal pulses.  Murmur heard.  Pulmonary/Chest: Effort normal and breath  sounds normal. No respiratory distress. She has no wheezes. She has no rales.  Neurological: She is alert and oriented to person, place, and time.  Skin: Skin is warm and dry. She is not diaphoretic.  Psychiatric: Her behavior is normal. Judgment normal.  Data Reviewed  Assessment  Left-sided headache, possible temporal arteritis  Plan  At her physician's request, she will be scheduled for a temporal artery biopsy. I explained the risk of the surgery which includes but is not limited to bleeding, infection, and the biopsy being nondiagnostic. Surgery will be scheduled urgently

## 2012-06-10 ENCOUNTER — Encounter (HOSPITAL_COMMUNITY): Payer: Self-pay | Admitting: Anesthesiology

## 2012-06-10 ENCOUNTER — Ambulatory Visit (HOSPITAL_COMMUNITY)
Admission: RE | Admit: 2012-06-10 | Discharge: 2012-06-10 | Disposition: A | Discharge status: Home / Self Care | Payer: Medicare Other | Source: Ambulatory Visit | Attending: Surgery | Admitting: Surgery

## 2012-06-10 ENCOUNTER — Encounter (HOSPITAL_COMMUNITY)
Admission: RE | Disposition: A | Discharge status: Home / Self Care | Payer: Self-pay | Source: Ambulatory Visit | Attending: Surgery

## 2012-06-10 ENCOUNTER — Encounter (HOSPITAL_COMMUNITY): Payer: Self-pay | Admitting: *Deleted

## 2012-06-10 ENCOUNTER — Ambulatory Visit (HOSPITAL_COMMUNITY): Payer: Medicare Other | Admitting: Anesthesiology

## 2012-06-10 DIAGNOSIS — R51 Headache: Secondary | ICD-10-CM

## 2012-06-10 HISTORY — PX: ARTERY BIOPSY: SHX891

## 2012-06-10 LAB — SURGICAL PCR SCREEN
MRSA, PCR: NEGATIVE
Staphylococcus aureus: NEGATIVE

## 2012-06-10 LAB — BASIC METABOLIC PANEL
BUN: 18 mg/dL (ref 6–23)
Chloride: 99 mEq/L (ref 96–112)
GFR calc Af Amer: 79 mL/min — ABNORMAL LOW (ref >90)
Glucose, Bld: 112 mg/dL — ABNORMAL HIGH (ref 70–99)
Potassium: 4.8 mEq/L (ref 3.5–5.1)
Sodium: 139 mEq/L (ref 135–145)

## 2012-06-10 LAB — CBC
HCT: 48.4 % — ABNORMAL HIGH (ref 36.0–46.0)
Hemoglobin: 14.9 g/dL (ref 12.0–15.0)
MCHC: 30.8 g/dL (ref 30.0–36.0)
RBC: 4.84 MIL/uL (ref 3.87–5.11)

## 2012-06-10 SURGERY — BIOPSY TEMPORAL ARTERY
Anesthesia: Monitor Anesthesia Care | Laterality: Left | Wound class: Clean

## 2012-06-10 MED ORDER — ACETAMINOPHEN 325 MG PO TABS: 650.0000 mg | ORAL_TABLET | ORAL | Status: DC | PRN

## 2012-06-10 MED ORDER — LIDOCAINE HCL (PF) 1 % IJ SOLN
INTRAMUSCULAR | Status: AC
  Filled 2012-06-10: qty 30

## 2012-06-10 MED ORDER — ACETAMINOPHEN 650 MG RE SUPP: 650.0000 mg | RECTAL | Status: DC | PRN

## 2012-06-10 MED ORDER — ONDANSETRON HCL 4 MG/2ML IJ SOLN: 4.0000 mg | Freq: Four times a day (QID) | INTRAMUSCULAR | Status: DC | PRN

## 2012-06-10 MED ORDER — PROPOFOL 10 MG/ML IV BOLUS
INTRAVENOUS | Status: DC | PRN
  Administered 2012-06-10: 20 mg via INTRAVENOUS

## 2012-06-10 MED ORDER — SODIUM CHLORIDE 0.9 % IJ SOLN: 3.0000 mL | INTRAMUSCULAR | Status: DC | PRN

## 2012-06-10 MED ORDER — FENTANYL CITRATE 0.05 MG/ML IJ SOLN
25.0000 ug | INTRAMUSCULAR | Status: DC | PRN
  Administered 2012-06-10 (×2): 25 ug via INTRAVENOUS

## 2012-06-10 MED ORDER — FENTANYL CITRATE 0.05 MG/ML IJ SOLN
INTRAMUSCULAR | Status: DC | PRN
  Administered 2012-06-10 (×2): 50 ug via INTRAVENOUS

## 2012-06-10 MED ORDER — FENTANYL CITRATE 0.05 MG/ML IJ SOLN: 25.0000 ug | INTRAMUSCULAR | Status: DC | PRN

## 2012-06-10 MED ORDER — LIDOCAINE HCL 1 % IJ SOLN
INTRAMUSCULAR | Status: DC | PRN
  Administered 2012-06-10: 4 mL

## 2012-06-10 MED ORDER — LACTATED RINGERS IV SOLN
INTRAVENOUS | Status: DC | PRN
  Administered 2012-06-10: 16:00:00 via INTRAVENOUS

## 2012-06-10 MED ORDER — MIDAZOLAM HCL 5 MG/5ML IJ SOLN
INTRAMUSCULAR | Status: DC | PRN
  Administered 2012-06-10: 2 mg via INTRAVENOUS

## 2012-06-10 MED ORDER — BUPIVACAINE HCL (PF) 0.25 % IJ SOLN
INTRAMUSCULAR | Status: AC
  Filled 2012-06-10: qty 30

## 2012-06-10 MED ORDER — ONDANSETRON HCL 4 MG/2ML IJ SOLN
INTRAMUSCULAR | Status: DC | PRN
  Administered 2012-06-10: 4 mg via INTRAVENOUS

## 2012-06-10 MED ORDER — FENTANYL CITRATE 0.05 MG/ML IJ SOLN
INTRAMUSCULAR | Status: AC
  Filled 2012-06-10: qty 2

## 2012-06-10 MED ORDER — PROPOFOL INFUSION 10 MG/ML OPTIME
INTRAVENOUS | Status: DC | PRN
  Administered 2012-06-10: 120 ug/kg/min via INTRAVENOUS

## 2012-06-10 MED ORDER — BUTALBITAL-ASA-CAFFEINE 50-325-40 MG PO CAPS: 1.0000 | ORAL_CAPSULE | ORAL | Status: DC | PRN

## 2012-06-10 MED ORDER — SODIUM CHLORIDE 0.9 % IJ SOLN: 3.0000 mL | Freq: Two times a day (BID) | INTRAMUSCULAR | Status: DC

## 2012-06-10 MED ORDER — SODIUM CHLORIDE 0.9 % IV SOLN: 250.0000 mL | INTRAVENOUS | Status: DC | PRN

## 2012-06-10 SURGICAL SUPPLY — 44 items
ADH SKN CLS APL DERMABOND .7 (GAUZE/BANDAGES/DRESSINGS) ×1
APL SKNCLS STERI-STRIP NONHPOA (GAUZE/BANDAGES/DRESSINGS) ×1
BALL CTTN LRG ABS STRL LF (GAUZE/BANDAGES/DRESSINGS) ×1
BENZOIN TINCTURE PRP APPL 2/3 (GAUZE/BANDAGES/DRESSINGS) ×1 IMPLANT
BLADE SURG 15 STRL LF DISP TIS (BLADE) ×1 IMPLANT
BLADE SURG 15 STRL SS (BLADE) ×2
CHLORAPREP W/TINT 10.5 ML (MISCELLANEOUS) ×2 IMPLANT
CLOTH BEACON ORANGE TIMEOUT ST (SAFETY) ×2 IMPLANT
CLSR STERI-STRIP ANTIMIC 1/2X4 (GAUZE/BANDAGES/DRESSINGS) ×1 IMPLANT
COTTONBALL LRG STERILE PKG (GAUZE/BANDAGES/DRESSINGS) ×2 IMPLANT
COVER SURGICAL LIGHT HANDLE (MISCELLANEOUS) ×2 IMPLANT
CRADLE DONUT ADULT HEAD (MISCELLANEOUS) ×2 IMPLANT
DERMABOND ADVANCED (GAUZE/BANDAGES/DRESSINGS) ×1
DERMABOND ADVANCED .7 DNX12 (GAUZE/BANDAGES/DRESSINGS) ×1 IMPLANT
DRAPE PED LAPAROTOMY (DRAPES) ×2 IMPLANT
DRESSING TELFA 8X3 (GAUZE/BANDAGES/DRESSINGS) ×3 IMPLANT
ELECT CAUTERY BLADE 6.4 (BLADE) ×2 IMPLANT
ELECT REM PT RETURN 9FT ADLT (ELECTROSURGICAL) ×2
ELECTRODE REM PT RTRN 9FT ADLT (ELECTROSURGICAL) ×1 IMPLANT
GAUZE SPONGE 4X4 16PLY XRAY LF (GAUZE/BANDAGES/DRESSINGS) ×2 IMPLANT
GEL ULTRASOUND 20GR AQUASONIC (MISCELLANEOUS) IMPLANT
GLOVE SURG SIGNA 7.5 PF LTX (GLOVE) ×1 IMPLANT
GLOVE SURG SS PI 7.5 STRL IVOR (GLOVE) ×1 IMPLANT
GOWN PREVENTION PLUS XLARGE (GOWN DISPOSABLE) ×2 IMPLANT
GOWN STRL NON-REIN LRG LVL3 (GOWN DISPOSABLE) ×2 IMPLANT
KIT BASIN OR (CUSTOM PROCEDURE TRAY) ×2 IMPLANT
KIT ROOM TURNOVER OR (KITS) ×2 IMPLANT
MARCAINE 0.25% 30ML IMPLANT
NDL HYPO 25GX1X1/2 BEV (NEEDLE) ×1 IMPLANT
NEEDLE HYPO 25GX1X1/2 BEV (NEEDLE) ×2 IMPLANT
NS IRRIG 1000ML POUR BTL (IV SOLUTION) ×2 IMPLANT
PACK SURGICAL SETUP 50X90 (CUSTOM PROCEDURE TRAY) ×2 IMPLANT
PAD ARMBOARD 7.5X6 YLW CONV (MISCELLANEOUS) ×2 IMPLANT
PENCIL BUTTON HOLSTER BLD 10FT (ELECTRODE) ×2 IMPLANT
SPECIMEN JAR SMALL (MISCELLANEOUS) ×2 IMPLANT
STRIP CLOSURE SKIN 1/2X4 (GAUZE/BANDAGES/DRESSINGS) ×2 IMPLANT
SUT VIC AB 3-0 54X BRD REEL (SUTURE) ×1 IMPLANT
SUT VIC AB 3-0 BRD 54 (SUTURE) ×2
SUT VIC AB 3-0 SH 27 (SUTURE) ×2
SUT VIC AB 3-0 SH 27XBRD (SUTURE) IMPLANT
SUT VIC AB 4-0 P-3 18X BRD (SUTURE) ×1 IMPLANT
SUT VIC AB 4-0 P3 18 (SUTURE) ×2
SYR CONTROL 10ML LL (SYRINGE) ×2 IMPLANT
TOWEL OR 17X26 10 PK STRL BLUE (TOWEL DISPOSABLE) ×2 IMPLANT

## 2012-06-10 NOTE — Preoperative (Signed)
Beta Blockers   Reason not to administer Beta Blockers:Not Applicable 

## 2012-06-10 NOTE — Transfer of Care (Signed)
Immediate Anesthesia Transfer of Care Note  Patient: Shelby [name]  Procedure(s) Performed: Procedure(s): LEFT BIOPSY TEMPORAL ARTERY (Left)  Patient Location: PACU  Anesthesia Type:MAC  Level of Consciousness: awake, alert  and oriented  Airway & Oxygen Therapy: Patient Spontanous Breathing and Patient connected to face mask oxygen  Post-op Assessment: Report given to PACU RN, Post -op Vital signs reviewed and stable and Patient moving all extremities  Post vital signs: Reviewed and stable  Complications: No apparent anesthesia complications

## 2012-06-10 NOTE — Op Note (Signed)
LEFT BIOPSY TEMPORAL ARTERY  Procedure Note  ILKA LOVICK 06/10/2012   Pre-op Diagnosis: left side headache     Post-op Diagnosis: same  Procedure(s): LEFT BIOPSY TEMPORAL ARTERY  Surgeon(s): Shelly Rubenstein, MD  Anesthesia: Monitor Anesthesia Care  Staff:  Circulator: Earnie Larsson, RN; Peggye Pitt, RN Scrub Person: Lennie Hummer  Estimated Blood Loss: Minimal               Specimens: sent to path          [facility] A   Date: 06/10/2012  Time: 4:37 PM

## 2012-06-10 NOTE — Anesthesia Preprocedure Evaluation (Signed)
Anesthesia Evaluation  Patient identified by MRN, date of birth, ID band Patient awake    Reviewed: Allergy & Precautions, H&P , NPO status , Patient's Chart, lab work & pertinent test results  Airway Mallampati: II  Neck ROM: full    Dental   Pulmonary shortness of breath, asthma ,          Cardiovascular     Neuro/Psych  Headaches, Depression  Neuromuscular disease    GI/Hepatic   Endo/Other    Renal/GU      Musculoskeletal  (+) Arthritis -, Fibromyalgia -  Abdominal   Peds  Hematology   Anesthesia Other Findings   Reproductive/Obstetrics                           Anesthesia Physical Anesthesia Plan  ASA: II  Anesthesia Plan: MAC   Post-op Pain Management:    Induction: Intravenous  Airway Management Planned: Simple Face Mask  Additional Equipment:   Intra-op Plan:   Post-operative Plan:   Informed Consent: I have reviewed the patients History and Physical, chart, labs and discussed the procedure including the risks, benefits and alternatives for the proposed anesthesia with the patient or authorized representative who has indicated his/her understanding and acceptance.     Plan Discussed with: Surgeon  Anesthesia Plan Comments:         Anesthesia Quick Evaluation

## 2012-06-10 NOTE — Progress Notes (Signed)
Pt states her pain is a 7/10 and she is nauseated.  Pt given some saltine crackers and coke.  Sipping on coke.  Family in room.

## 2012-06-10 NOTE — Interval H&P Note (Signed)
History and Physical Interval Note: no change in H and P  06/10/2012 1:53 PM  Shelby [name]  has presented today for surgery, with the diagnosis of left side headache  The various methods of treatment have been discussed with the patient and family. After consideration of risks, benefits and other options for treatment, the patient has consented to  Procedure(s): LEFT BIOPSY TEMPORAL ARTERY (Left) as a surgical intervention .  The patient's history has been reviewed, patient examined, no change in status, stable for surgery.  I have reviewed the patient's chart and labs.  Questions were answered to the patient's satisfaction.     Rhandi Despain A

## 2012-06-11 ENCOUNTER — Telehealth: Payer: Self-pay | Admitting: Cardiology

## 2012-06-11 NOTE — Telephone Encounter (Signed)
New Problem:    Called in needing diazepam (VALIUM) 5 MG tablet.  Please call back.

## 2012-06-11 NOTE — Op Note (Signed)
NAMECRISSY, Shelby [name] NO.:  [id number]  MEDICAL RECORD NO.:  [id number]  LOCATION:  [facility]                         FACILITY:  [facility]  PHYSICIAN:  Abigail Miyamoto, M.D. DATE OF BIRTH:  19-Jan-1941  DATE OF PROCEDURE:  06/10/2012 DATE OF DISCHARGE:  06/10/2012                              OPERATIVE REPORT   PREOPERATIVE DIAGNOSIS:  Left-sided headache.  POSTOPERATIVE DIAGNOSIS:  Left-sided headache.  PROCEDURE:  Left temporal artery biopsy.  SURGEON:  Abigail Miyamoto, M.D.  ANESTHESIA:  1% lidocaine and monitored anesthesia care.  ESTIMATED BLOOD LOSS:  Minimal.  PROCEDURE:  The patient was brought to the operating room, identified as [employer].  She was placed supine on the operating room table and anesthesia was induced.  The left temporal area was then prepped and draped in the usual sterile fashion.  I anesthetized the skin and __________ the area with 1% lidocaine.  I made a longitudinal incision, and from there with a scalpel took this down through the platysma with electrocautery.  The temporal artery was then identified and controlled proximally and distally with Vicryl ties and then transected with a scalpel.  No other abnormalities were identified.  Hemostasis appeared to be achieved with cautery.  I then reapproximated the subcutaneous tissue with interrupted 3-0 Vicryl sutures and closed the skin with a running 4-0 Vicryl suture.  Steri-Strips were then applied.  The patient tolerated the procedure well.  All the counts were correct at the end of the procedure.  The patient was then taken in a stable condition from the operating room to the recovery room.     Abigail Miyamoto, M.D.     DB/MEDQ  D:  06/10/2012  T:  06/11/2012  Job:  [id number]

## 2012-06-11 NOTE — Anesthesia Postprocedure Evaluation (Signed)
Anesthesia Post Note  Patient: Shelby [name]  Procedure(s) Performed: Procedure(s) (LRB): LEFT BIOPSY TEMPORAL ARTERY (Left)  Anesthesia type: MAC  Patient location: PACU  Post pain: Pain level controlled  Post assessment: Patient's Cardiovascular Status Stable  Last Vitals:  Filed Vitals:   06/10/12 1835  BP: 133/55  Pulse: 65  Temp:   Resp: 16    Post vital signs: Reviewed and stable  Level of consciousness: alert  Complications: No apparent anesthesia complications

## 2012-06-12 ENCOUNTER — Encounter (INDEPENDENT_AMBULATORY_CARE_PROVIDER_SITE_OTHER): Payer: Medicare Other

## 2012-06-12 ENCOUNTER — Other Ambulatory Visit: Payer: Self-pay | Admitting: *Deleted

## 2012-06-12 DIAGNOSIS — F419 Anxiety disorder, unspecified: Secondary | ICD-10-CM

## 2012-06-12 NOTE — Progress Notes (Deleted)
[facility] [address], [place] [address] [phone]  Date:  06/12/2012   Name:  Shelby [name]   DOB:  Jul 07, 1940   MRN:  [id number]  PCP:  Cassell Clement, MD    Chief Complaint: Follow-up   History of Present Illness:  Shelby [name] is a 72 y.o. very pleasant female patient who presents with the following:  ***  Patient Active Problem List  Diagnosis  . HYPERLIPIDEMIA  . MITRAL VALVE PROLAPSE  . ALLERGIC RHINITIS  . ASTHMA, CHILDHOOD  . DYSPNEA  . COUGH  . BREAST CANCER, HX OF  . Positional vertigo  . Fibromyalgia  . Colon cancer    Past Medical History  Diagnosis Date  . MVP (mitral valve prolapse)   . Eosinophilia   . Syncope     secondary to vasovagal phenomenon  . Hypercholesterolemia     statin intolerant  . Ovarian cyst   . Heart murmur     mitral valve prolapse  . Depression   . Breast cancer 2001    s/p surgery and chemo   . Colon cancer 2012  . Allergy   . Arthritis   . Asthma   . Migraine     Past Surgical History  Procedure Laterality Date  . Mastectomy  2001  . Colonscopy    . Back surgery  [id number],[id number]  . Shoulder surgery  2000  . Spine surgery    . Cosmetic surgery      face lift  . Breast surgery      2000-mastectomy and TRAM with mesh  . Vaginal hysterectomy  1979  . Appendectomy  1970  . Laparoscopic salpingoopherectomy  8.29.12    LAP.W/BSO    . Colon surgery    . Rotator cuff repair      History  Substance Use Topics  . Smoking status: Never Smoker   . Smokeless tobacco: Never Used  . Alcohol Use: 0.6 oz/week    1 Glasses of wine per week    Family History  Problem Relation Age of Onset  . Arthritis Mother   . Heart disease Father   . Heart attack Father   . Hypertension Father   . Cancer Son     rectal cancer-died age 48  . Diabetes Paternal Aunt   . Breast cancer Paternal Grandfather     Allergies  Allergen Reactions  . Latex Rash  . Benzonatate     REACTION:  migraines  . Codeine     REACTION: Nausea  . Morphine And Related     itching  . Skelaxin (Metaxalone) Other (See Comments)    "Makes Pt feel crazy"  . Penicillins Rash  . Sulfonamide Derivatives Rash    Medication list has been reviewed and updated.  Current Outpatient Prescriptions on File Prior to Visit  Medication Sig Dispense Refill  . amitriptyline (ELAVIL) 50 MG tablet Take 50-100 mg by mouth at bedtime as needed for sleep (for sleep).      Marland Kitchen atenolol (TENORMIN) 25 MG tablet Take 25-50 mg by mouth daily. takes 25 mg daily but will take 50 mg if heart is racing      . butalbital-aspirin-caffeine (FIORINAL) 50-325-40 MG per capsule Take 1 capsule by mouth every 4 (four) hours as needed for headache.  25 capsule  1  . Calcium Carbonate-Vitamin D (CALCIUM 600 + D PO) Take 1 tablet by mouth 2 (two) times daily.       . diazepam (VALIUM)  5 MG tablet Take 5 mg by mouth as needed (for pain).      . predniSONE (DELTASONE) 20 MG tablet Take 60 mg by mouth daily.      . Tretinoin, Facial Wrinkles, (RENOVA EX) Apply 1 application topically at bedtime.       No current facility-administered medications on file prior to visit.    Review of Systems:  ***  Physical Examination: Filed Vitals:   06/12/12 1235  BP: 130/70  Pulse: 64  Temp: 98.5 F (36.9 C)  Resp: 16   Filed Vitals:   06/12/12 1235  Height: 5' 3.5" (1.613 m)  Weight: 133 lb (60.328 kg)   Body mass index is 23.19 kg/(m^2). Ideal Body Weight: Weight in (lb) to have BMI = 25: 143.1  ***  Assessment and Plan: ***  Signed Abbe Amsterdam, MD

## 2012-06-12 NOTE — Telephone Encounter (Signed)
Patient calling to check on the status of her Valium. Wants to talk to [facility] or Dr Patty Sermons to see why it has not been refilled. I let her kno wi will forward this message to [place]. She said ok but still wants a call back.   Micki Riley, CMA

## 2012-06-13 MED ORDER — DIAZEPAM 5 MG PO TABS: 5.0000 mg | ORAL_TABLET | Freq: Every day | ORAL | Status: DC | PRN

## 2012-06-15 ENCOUNTER — Encounter (HOSPITAL_COMMUNITY): Payer: Self-pay | Admitting: Surgery

## 2012-06-22 ENCOUNTER — Ambulatory Visit (INDEPENDENT_AMBULATORY_CARE_PROVIDER_SITE_OTHER): Payer: Medicare Other | Admitting: Surgery

## 2012-06-22 ENCOUNTER — Encounter (INDEPENDENT_AMBULATORY_CARE_PROVIDER_SITE_OTHER): Payer: Self-pay | Admitting: Surgery

## 2012-06-22 VITALS — BP 122/78 | HR 71 | Temp 97.5°F | Resp 18 | Ht 63.5 in | Wt 134.4 lb

## 2012-06-22 DIAGNOSIS — Z09 Encounter for follow-up examination after completed treatment for conditions other than malignant neoplasm: Secondary | ICD-10-CM

## 2012-06-22 NOTE — Progress Notes (Signed)
Subjective:     Patient ID: Shelby [name], female   DOB: [birth year], 72 y.o.   MRN: [id number]  HPI She is here for her first postoperative visit status post left temporal artery biopsy She is doing well has no complaints  Review of Systems     Objective:   Physical Exam On exam, the incision is well-healed. The final path showed no evidence of arteritis    Assessment:     Patient stable postop     Plan:     I will see her back as needed

## 2012-06-26 ENCOUNTER — Other Ambulatory Visit (INDEPENDENT_AMBULATORY_CARE_PROVIDER_SITE_OTHER): Payer: Medicare Other

## 2012-06-26 DIAGNOSIS — E7800 Pure hypercholesterolemia, unspecified: Secondary | ICD-10-CM

## 2012-06-26 LAB — HEPATIC FUNCTION PANEL
AST: 20 U/L (ref 0–37)
Alkaline Phosphatase: 48 U/L (ref 39–117)
Total Bilirubin: 0.3 mg/dL (ref 0.3–1.2)

## 2012-06-26 LAB — BASIC METABOLIC PANEL
CO2: 29 mEq/L (ref 19–32)
Calcium: 9.3 mg/dL (ref 8.4–10.5)
Chloride: 98 mEq/L (ref 96–112)
Creatinine, Ser: 0.9 mg/dL (ref 0.4–1.2)
Glucose, Bld: 77 mg/dL (ref 70–99)

## 2012-06-26 LAB — LDL CHOLESTEROL, DIRECT: Direct LDL: 181.1 mg/dL

## 2012-06-26 LAB — LIPID PANEL
Total CHOL/HDL Ratio: 6
VLDL: 87.2 mg/dL — ABNORMAL HIGH (ref 0.0–40.0)

## 2012-06-26 NOTE — Progress Notes (Signed)
Quick Note:  Please make copy of labs for patient visit. ______ 

## 2012-06-29 ENCOUNTER — Encounter: Payer: Self-pay | Admitting: Cardiology

## 2012-06-29 ENCOUNTER — Ambulatory Visit (INDEPENDENT_AMBULATORY_CARE_PROVIDER_SITE_OTHER): Payer: Medicare Other | Admitting: Cardiology

## 2012-06-29 VITALS — BP 120/72 | HR 80 | Ht 63.5 in | Wt 135.8 lb

## 2012-06-29 DIAGNOSIS — I059 Rheumatic mitral valve disease, unspecified: Secondary | ICD-10-CM

## 2012-06-29 DIAGNOSIS — I341 Nonrheumatic mitral (valve) prolapse: Secondary | ICD-10-CM

## 2012-06-29 DIAGNOSIS — R51 Headache: Secondary | ICD-10-CM

## 2012-06-29 DIAGNOSIS — E785 Hyperlipidemia, unspecified: Secondary | ICD-10-CM

## 2012-06-29 DIAGNOSIS — E7800 Pure hypercholesterolemia, unspecified: Secondary | ICD-10-CM

## 2012-06-29 DIAGNOSIS — G43009 Migraine without aura, not intractable, without status migrainosus: Secondary | ICD-10-CM | POA: Insufficient documentation

## 2012-06-29 NOTE — Progress Notes (Signed)
Shelby [name] Date of Birth:  September 19, 1940 [facility] [address] [place], [place]  [address] [phone]  Fax   [phone]  HPI: This pleasant 72 year old woman is seen for a scheduled followup office visit. She has a past history of hypercholesterolemia, anemia and suspected mitral valve prolapse. She also has a past history of breast cancer of the right breast with reconstruction. She also more recently in the summer of 2012 on a routine colonoscopy was found to have cancer of the right colon which was asymptomatic and this was successfully removed. All lymph nodes were negative and she did not require any subsequent chemotherapy. She is doing well postop except that she tends to be more prone to diarrhea and loose stools now that this section of her colon has been removed. The patient recently also was complaining of a lot of pain deep in her bones. We obtained a PET scan which did not show any definite cancer but raise the question of an abnormality in her spine. However subsequent MRI did not show any cancer of her spine.  The patient has a history of probable fibromyalgia. She has had a trial of Skelaxin. She is also found that the Valium at bedtime helps as much as anything.  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   Current Outpatient Prescriptions  Medication Sig Dispense Refill  . amitriptyline (ELAVIL) 50 MG tablet Take 50-100 mg by mouth at bedtime as needed for sleep (for sleep).      Marland Kitchen atenolol (TENORMIN) 25 MG tablet Take 25-50 mg by mouth daily. takes 25 mg daily but will take 50 mg if heart is racing      . Calcium Carbonate-Vitamin D (CALCIUM 600 + D PO) Take 1 tablet by mouth 2 (two) times daily.       . diazepam (VALIUM) 5 MG tablet Take 1 tablet (5 mg total) by mouth daily as needed.  30 tablet  3  . Tretinoin, Facial Wrinkles, (RENOVA EX) Apply 1 application topically at bedtime.       No current  facility-administered medications for this visit.    Allergies  Allergen Reactions  . Latex Rash  . Benzonatate     REACTION: migraines  . Codeine     REACTION: Nausea  . Morphine And Related     itching  . Skelaxin (Metaxalone) Other (See Comments)    "Makes Pt feel crazy"  . Penicillins Rash  . Sulfonamide Derivatives Rash    Patient Active Problem List  Diagnosis  . HYPERLIPIDEMIA  . MITRAL VALVE PROLAPSE  . ALLERGIC RHINITIS  . ASTHMA, CHILDHOOD  . DYSPNEA  . COUGH  . BREAST CANCER, HX OF  . Positional vertigo  . Fibromyalgia  . Colon cancer    History  Smoking status  . Never Smoker   Smokeless tobacco  . Never Used    History  Alcohol Use  . 0.6 oz/week  . 1 Glasses of wine per week    Family History  Problem Relation Age of Onset  . Arthritis Mother   . Heart disease Father   . Heart attack Father   . Hypertension Father   . Cancer Son     rectal cancer-died age 2  . Diabetes Paternal Aunt   . Breast cancer Paternal Grandfather     Review of Systems: The patient denies any heat or cold intolerance.  No weight gain or weight loss.  The patient denies headaches or  blurry vision.  There is no cough or sputum production.  The patient denies dizziness.  There is no hematuria or hematochezia.  The patient denies any muscle aches or arthritis.  The patient denies any rash.  The patient denies frequent falling or instability.  There is no history of depression or anxiety.  All other systems were reviewed and are negative.   Physical Exam: Filed Vitals:   06/29/12 0901  BP: 120/72  Pulse: 80   the general appearance reveals a well-developed well-nourished woman in no distress.The head and neck exam reveals pupils equal and reactive.  Extraocular movements are full.  There is no scleral icterus.  The mouth and pharynx are normal.  The neck is supple.  The carotids reveal no bruits.  The jugular venous pressure is normal.  The  thyroid is not enlarged.   There is no lymphadenopathy.  The chest is clear to percussion and auscultation.  There are no rales or rhonchi.  Expansion of the chest is symmetrical.  The precordium is quiet.  The first heart sound is normal.  The second heart sound is physiologically split.  There is no murmur gallop rub or click.  There is no abnormal lift or heave.  The abdomen is soft and nontender.  The bowel sounds are normal.  The liver and spleen are not enlarged.  There are no abdominal masses.  There are no abdominal bruits.  Extremities reveal good pedal pulses.  There is no phlebitis or edema.  There is no cyanosis or clubbing.  Strength is normal and symmetrical in all extremities.  There is no lateralizing weakness.  There are no sensory deficits.  The skin is warm and dry.  There is no rash.     Assessment / Plan: Continue on same medication.  Recheck here in 4 months for followup office visit lipid panel hepatic function panel and basal metabolic panel.  Encouraged to increase walking exercise and to lose weight.

## 2012-06-29 NOTE — Patient Instructions (Addendum)
Your physician recommends that you continue on your current medications as directed. Please refer to the Current Medication list given to you today.  Your physician wants you to follow-up in: 4 months with fasting labs (lp/bmet/hfp) You will receive a reminder letter in the mail two months in advance. If you don't receive a letter, please call our office to schedule the follow-up appointment.  

## 2012-06-29 NOTE — Assessment & Plan Note (Signed)
Last month the patient had sudden onset of severe stabbing headaches.  She also had some ocular symptoms.  Her ophthalmologist Dr. Randon Goldsmith was concerned about possible temporal arteritis and she proceeded to get a temporal artery biopsy.  The biopsy came back negative.  In the meantime the patient had been started on 10 days of prednisone empirically.  She is now on gabapentin from Dr. Neale Burly at the [facility] and she will also be getting a series of injections in her head every 2 weeks to help with the idiopathic stabbing migraine syndrome.

## 2012-06-29 NOTE — Assessment & Plan Note (Signed)
The patient has not been expressing any recent palpitations or chest pain or symptoms from her mitral valve prolapse.  She does not walk regularly when she is in [place] but [place] at the beach [place] to the beach

## 2012-06-29 NOTE — Assessment & Plan Note (Signed)
We reviewed her lipids.  Triglycerides are higher probably secondary to recent high-dose steroids for her idiopathic stabbing migraine headaches

## 2012-07-02 ENCOUNTER — Encounter: Payer: Self-pay | Admitting: Family Medicine

## 2012-07-02 DIAGNOSIS — R51 Headache: Secondary | ICD-10-CM

## 2012-07-16 ENCOUNTER — Other Ambulatory Visit: Payer: Self-pay | Admitting: Cardiology

## 2012-07-16 DIAGNOSIS — G4700 Insomnia, unspecified: Secondary | ICD-10-CM

## 2012-07-16 NOTE — Telephone Encounter (Signed)
Pt needs Amitriptyline refilled for sleep will send to Dr. Marjory Sneddon

## 2012-07-17 MED ORDER — AMITRIPTYLINE HCL 50 MG PO TABS: 50.0000 mg | ORAL_TABLET | Freq: Every evening | ORAL | Status: DC | PRN

## 2012-07-28 ENCOUNTER — Other Ambulatory Visit: Payer: Self-pay

## 2012-07-28 DIAGNOSIS — Z1231 Encounter for screening mammogram for malignant neoplasm of breast: Secondary | ICD-10-CM

## 2012-08-07 ENCOUNTER — Other Ambulatory Visit: Payer: Self-pay | Admitting: *Deleted

## 2012-08-07 MED ORDER — ATENOLOL 25 MG PO TABS: 25.0000 mg | ORAL_TABLET | Freq: Every day | ORAL | Status: DC

## 2012-09-03 ENCOUNTER — Ambulatory Visit: Payer: Medicare Other

## 2012-09-28 ENCOUNTER — Other Ambulatory Visit: Payer: Self-pay | Admitting: Cardiology

## 2012-09-28 DIAGNOSIS — F419 Anxiety disorder, unspecified: Secondary | ICD-10-CM
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
## 2012-09-29 ENCOUNTER — Ambulatory Visit
Admission: RE | Admit: 2012-09-29 | Discharge: 2012-09-29 | Disposition: A | Discharge status: Home / Self Care | Payer: Medicare Other | Source: Ambulatory Visit

## 2012-09-29 DIAGNOSIS — Z1231 Encounter for screening mammogram for malignant neoplasm of breast: Secondary | ICD-10-CM

## 2012-09-29 MED ORDER — DIAZEPAM 5 MG PO TABS: 5.0000 mg | ORAL_TABLET | Freq: Every day | ORAL | Status: DC | PRN
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
## 2012-09-29 NOTE — Telephone Encounter (Signed)
Follow up  Pt is calling regarding her Valium

## 2012-10-01 ENCOUNTER — Other Ambulatory Visit: Payer: Self-pay | Admitting: Cardiology

## 2012-10-01 DIAGNOSIS — N644 Mastodynia: Secondary | ICD-10-CM

## 2012-10-16 NOTE — Progress Notes (Signed)
This encounter was created in error - please disregard.

## 2012-10-21 ENCOUNTER — Ambulatory Visit
Admission: RE | Admit: 2012-10-21 | Discharge: 2012-10-21 | Disposition: A | Discharge status: Home / Self Care | Payer: Medicare Other | Source: Ambulatory Visit | Attending: Cardiology | Admitting: Cardiology

## 2012-10-21 ENCOUNTER — Other Ambulatory Visit: Payer: Self-pay

## 2012-10-21 ENCOUNTER — Other Ambulatory Visit: Payer: Self-pay | Admitting: Cardiology

## 2012-10-21 DIAGNOSIS — N644 Mastodynia: Secondary | ICD-10-CM

## 2012-10-23 IMAGING — CR DG CHEST 2V
2 series · 2 of 2 positions shown · non-contrast
Comparison: 12/14/2007 chest radiograph from [REDACTED]

CLINICAL DATA: Carcinoma of the colon, preoperative assessment

CHEST - 2 VIEW

[w chest pa]
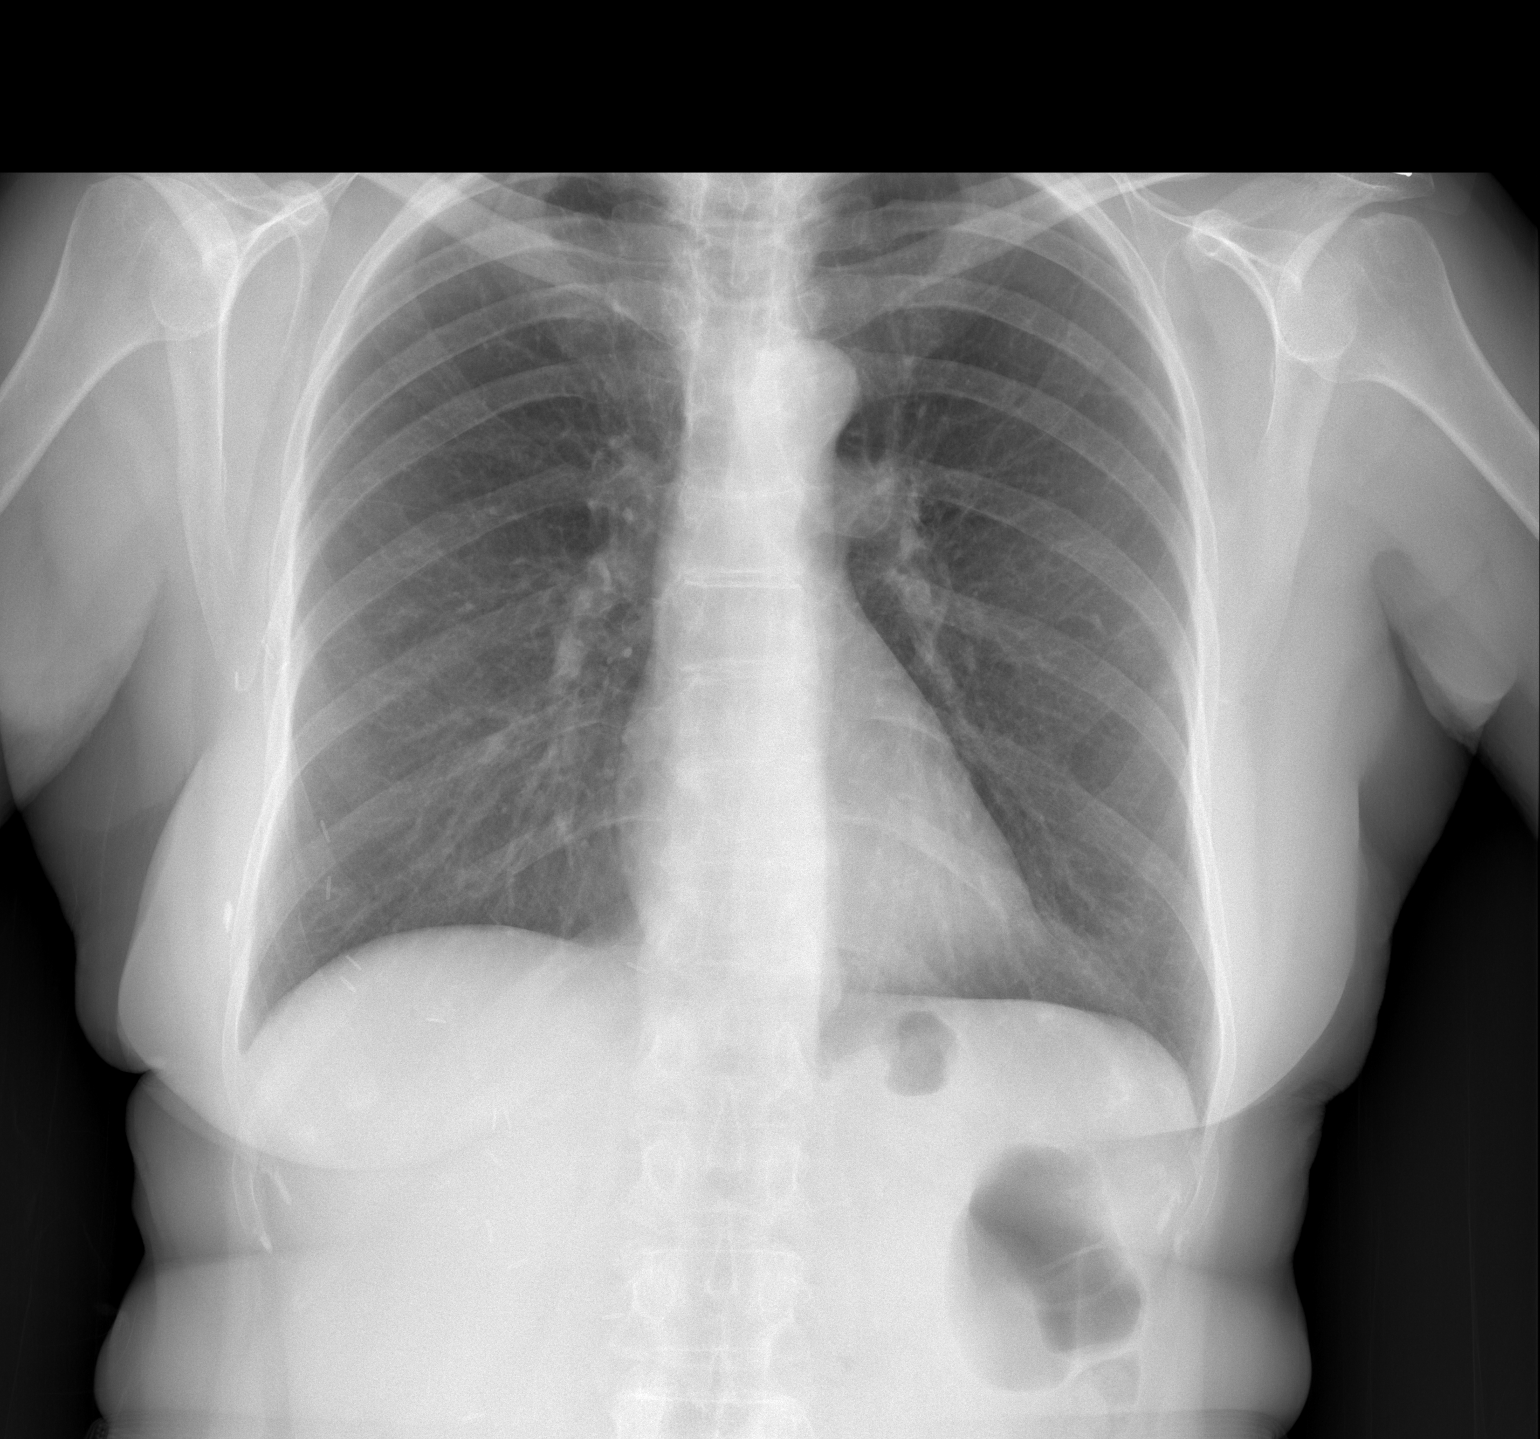

[w chest lat]
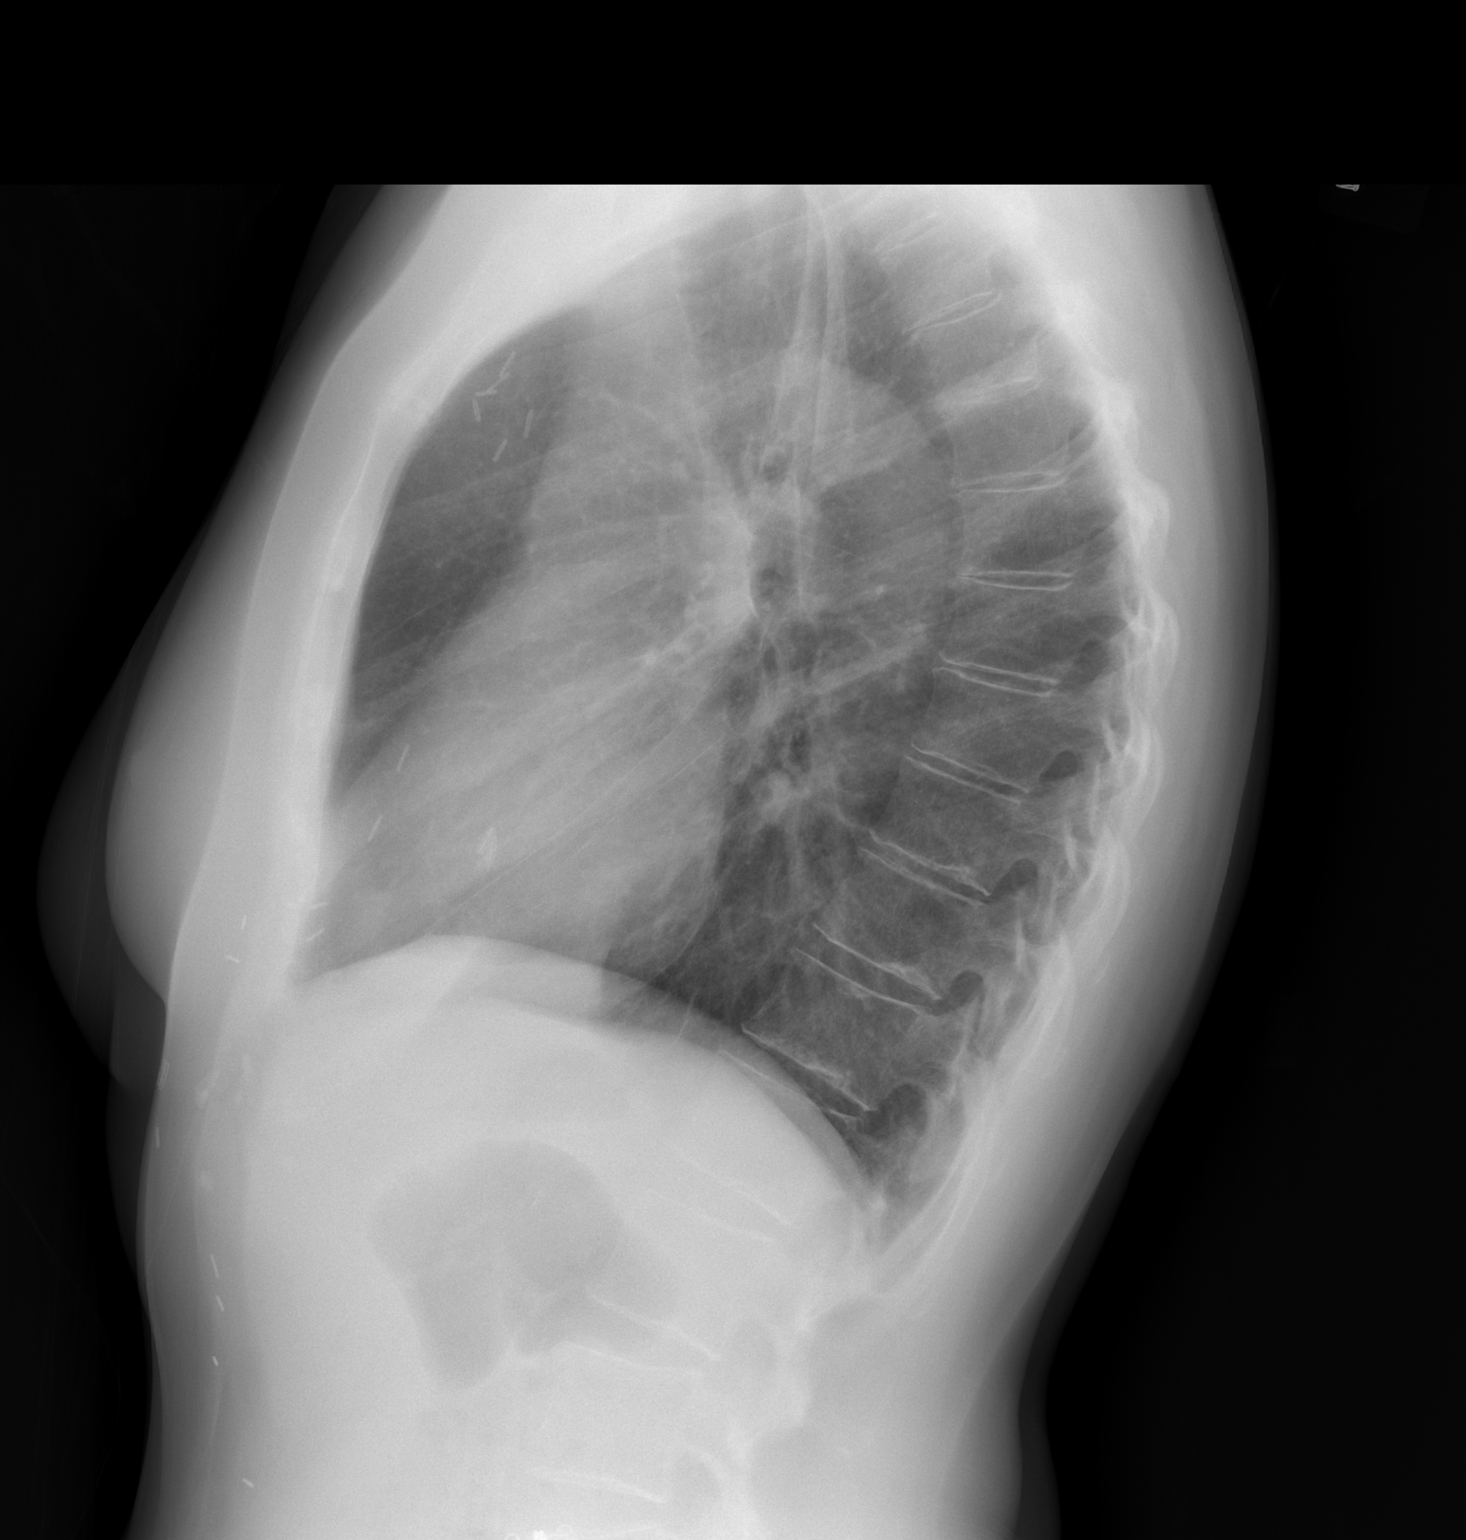

[2 of 2 positions shown; findings below may reference images not displayed]

FINDINGS: Normal heart size, mediastinal contours, and pulmonary vascularity.
Minimal biapical scarring.
Lungs clear.
No pleural effusion or pneumothorax.
Surgical clips right upper quadrant and right anterior chest.
No acute bony findings.
IMPRESSION: No acute abnormalities.

## 2012-12-10 ENCOUNTER — Encounter: Payer: Self-pay | Admitting: *Deleted

## 2012-12-10 ENCOUNTER — Encounter: Payer: Self-pay | Admitting: Cardiology

## 2013-01-05 ENCOUNTER — Ambulatory Visit (INDEPENDENT_AMBULATORY_CARE_PROVIDER_SITE_OTHER): Payer: Medicare Other | Admitting: Cardiology

## 2013-01-05 ENCOUNTER — Encounter: Payer: Self-pay | Admitting: Cardiology

## 2013-01-05 VITALS — BP 122/78 | HR 82 | Ht 63.0 in | Wt 137.0 lb

## 2013-01-05 DIAGNOSIS — M797 Fibromyalgia: Secondary | ICD-10-CM

## 2013-01-05 DIAGNOSIS — C189 Malignant neoplasm of colon, unspecified: Secondary | ICD-10-CM

## 2013-01-05 DIAGNOSIS — IMO0001 Reserved for inherently not codable concepts without codable children: Secondary | ICD-10-CM

## 2013-01-05 DIAGNOSIS — I341 Nonrheumatic mitral (valve) prolapse: Secondary | ICD-10-CM

## 2013-01-05 DIAGNOSIS — E7800 Pure hypercholesterolemia, unspecified: Secondary | ICD-10-CM

## 2013-01-05 DIAGNOSIS — E785 Hyperlipidemia, unspecified: Secondary | ICD-10-CM

## 2013-01-05 DIAGNOSIS — I059 Rheumatic mitral valve disease, unspecified: Secondary | ICD-10-CM

## 2013-01-05 LAB — HEPATIC FUNCTION PANEL
ALT: 26 U/L (ref 0–35)
AST: 36 U/L (ref 0–37)
Albumin: 4.4 g/dL (ref 3.5–5.2)
Total Bilirubin: 0.5 mg/dL (ref 0.3–1.2)
Total Protein: 7.6 g/dL (ref 6.0–8.3)

## 2013-01-05 LAB — BASIC METABOLIC PANEL
Calcium: 9.6 mg/dL (ref 8.4–10.5)
GFR: 64.37 mL/min (ref >60.00)
Glucose, Bld: 92 mg/dL (ref 70–99)
Sodium: 135 mEq/L (ref 135–145)

## 2013-01-05 LAB — LIPID PANEL
Cholesterol: 328 mg/dL — ABNORMAL HIGH (ref 0–200)
Triglycerides: 825 mg/dL — ABNORMAL HIGH (ref 0.0–149.0)

## 2013-01-05 LAB — LDL CHOLESTEROL, DIRECT: Direct LDL: 174.3 mg/dL

## 2013-01-05 NOTE — Progress Notes (Signed)
Shelby [name] Date of Birth:  17-Jun-1940 [address] [place], [place]  [address] [phone]  Fax   [phone]  HPI: This pleasant 72 year old woman is seen for a scheduled followup office visit. She has a past history of hypercholesterolemia, anemia and suspected mitral valve prolapse. She also has a past history of breast cancer of the right breast with reconstruction. She also more recently in the summer of 2012 on a routine colonoscopy was found to have cancer of the right colon which was asymptomatic and this was successfully removed. All lymph nodes were negative and she did not require any subsequent chemotherapy. She is doing well postop except that she tends to be more prone to diarrhea and loose stools now that this section of her colon has been removed. The patient recently also was complaining of a lot of pain deep in her bones. We obtained a PET scan which did not show any definite cancer but raise the question of an abnormality in her spine. However subsequent MRI did not show any cancer of her spine.  Since last visit the patient has been experiencing more diffuse myalgias which she attributes to her fibromyalgia.  The episodes usually last for several days and she finds that taking a Valium helps with the relief of pain.  The patient has chronic headaches.  She [facility] and Dr. Neale Burly.  She attributes her weight gain to the adverse effect of gabapentin. The patient has a history of probable fibromyalgia. She has had a trial of Skelaxin. She is also found that the Valium at bedtime helps as much as anything.  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   Current Outpatient Prescriptions  Medication Sig Dispense Refill  . amitriptyline (ELAVIL) 50 MG tablet Take 1-2 tablets (50-100 mg total) by mouth at bedtime as needed for sleep (for sleep).  60 tablet  5  . atenolol (TENORMIN) 25 MG  tablet Take 1-2 tablets (25-50 mg total) by mouth daily. takes 25 mg daily but will take 50 mg if heart is racing  60 tablet  3  . Calcium Carbonate-Vitamin D (CALCIUM 600 + D PO) Take 1 tablet by mouth 2 (two) times daily.       . diazepam (VALIUM) 5 MG tablet Take 1 tablet (5 mg total) by mouth daily as needed.  30 tablet  5  . gabapentin (NEURONTIN) 100 MG capsule Take 100 mg by mouth as needed.      . gabapentin (NEURONTIN) 300 MG capsule Take 1 capsule by mouth daily.      . Tretinoin, Facial Wrinkles, (RENOVA EX) Apply 1 application topically at bedtime.       No current facility-administered medications for this visit.    Allergies  Allergen Reactions  . Latex Rash  . Benzonatate     REACTION: migraines  . Codeine     REACTION: Nausea  . Morphine And Related     itching  . Skelaxin [Metaxalone] Other (See Comments)    "Makes Pt feel crazy"  . Penicillins Rash  . Sulfonamide Derivatives Rash    Patient Active Problem List   Diagnosis Date Noted  . Positional vertigo 07/13/2010    Priority: High  . HYPERLIPIDEMIA 12/14/2007    Priority: High  . MITRAL VALVE PROLAPSE 12/14/2007    Priority: High  . Headache 06/29/2012  . Colon cancer 11/11/2011  . Fibromyalgia 03/01/2011  . DYSPNEA 01/13/2008  .  ALLERGIC RHINITIS 12/14/2007  . ASTHMA, CHILDHOOD 12/14/2007  . COUGH 12/14/2007  . BREAST CANCER, HX OF 12/14/2007    History  Smoking status  . Never Smoker   Smokeless tobacco  . Never Used    History  Alcohol Use  . 0.6 oz/week  . 1 Glasses of wine per week    Family History  Problem Relation Age of Onset  . Arthritis Mother   . Heart disease Father   . Heart attack Father   . Hypertension Father   . Cancer Son     rectal cancer-died age 45  . Diabetes Paternal Aunt   . Breast cancer Paternal Grandfather     Review of Systems: The patient denies any heat or cold intolerance.  No weight gain or weight loss.  The patient denies headaches or blurry  vision.  There is no cough or sputum production.  The patient denies dizziness.  There is no hematuria or hematochezia.  The patient denies any muscle aches or arthritis.  The patient denies any rash.  The patient denies frequent falling or instability.  There is no history of depression or anxiety.  All other systems were reviewed and are negative.   Physical Exam: Filed Vitals:   01/05/13 0828  BP: 122/78  Pulse: 82   the general appearance reveals a well-developed well-nourished woman in no distress.The head and neck exam reveals pupils equal and reactive.  Extraocular movements are full.  There is no scleral icterus.  The mouth and pharynx are normal.  The neck is supple.  The carotids reveal no bruits.  The jugular venous pressure is normal.  The  thyroid is not enlarged.  There is no lymphadenopathy.  The chest is clear to percussion and auscultation.  There are no rales or rhonchi.  Expansion of the chest is symmetrical.  The precordium is quiet.  The first heart sound is normal.  The second heart sound is physiologically split.  There is no murmur gallop rub or click.  There is no abnormal lift or heave.  The abdomen is soft and nontender.  The bowel sounds are normal.  The liver and spleen are not enlarged.  There are no abdominal masses.  There are no abdominal bruits.  Extremities reveal good pedal pulses.  There is no phlebitis or edema.  There is no cyanosis or clubbing.  Strength is normal and symmetrical in all extremities.  There is no lateralizing weakness.  There are no sensory deficits.  The skin is warm and dry.  There is no rash.     Assessment / Plan: Continue on same medication.  Recheck here in 4 months for followup office visit lipid panel hepatic function panel and basal metabolic panel.  Encouraged to increase walking exercise and to lose weight.  They are spending most of the time at [facility] now.

## 2013-01-05 NOTE — Progress Notes (Signed)
Quick Note:  Please report to patient. The recent labs are stable. Continue same medication and careful diet. The liver tests are normal. The total cholesterol and TGs are higher. Needs to lose weight, increase walking exercise, strict diet. ______

## 2013-01-05 NOTE — Assessment & Plan Note (Signed)
Patient has a history of hyperlipidemia.  She is intolerant of statins.  We are checking fasting lab work today.

## 2013-01-05 NOTE — Assessment & Plan Note (Signed)
The patient has not been experiencing any new: Symptoms.  She has had previous colon resection for cancer.  No hematochezia or melena.  She has had a 2 pound weight gain since last visit.

## 2013-01-05 NOTE — Assessment & Plan Note (Signed)
The patient has not been experiencing any recent palpitations or chest pain.  No symptoms of CHF.

## 2013-01-05 NOTE — Patient Instructions (Signed)

## 2013-01-05 NOTE — Assessment & Plan Note (Signed)
Her symptoms of fibromyalgia are worse.  They do seem to respond to Valium.

## 2013-01-06 ENCOUNTER — Telehealth: Payer: Self-pay | Admitting: *Deleted

## 2013-01-06 NOTE — Telephone Encounter (Signed)
Message copied by Burnell Blanks on Wed Jan 06, 2013 11:24 AM ------      Message from: Cassell Clement      Created: Tue Jan 05, 2013  8:43 PM       Please report to patient.  The recent labs are stable. Continue same medication and careful diet. The liver tests are normal.  The total cholesterol and TGs are higher. Needs to lose weight, increase walking exercise, strict diet. ------

## 2013-01-06 NOTE — Telephone Encounter (Signed)
Advised patient of lab results  

## 2013-01-07 ENCOUNTER — Encounter: Payer: Self-pay | Admitting: Cardiology

## 2013-01-21 ENCOUNTER — Other Ambulatory Visit: Payer: Self-pay

## 2013-02-18 ENCOUNTER — Other Ambulatory Visit: Payer: Self-pay | Admitting: Cardiology

## 2013-05-06 ENCOUNTER — Other Ambulatory Visit (INDEPENDENT_AMBULATORY_CARE_PROVIDER_SITE_OTHER): Payer: Medicare Other

## 2013-05-06 DIAGNOSIS — E7800 Pure hypercholesterolemia, unspecified: Secondary | ICD-10-CM

## 2013-05-06 LAB — HEPATIC FUNCTION PANEL
ALBUMIN: 4.3 g/dL (ref 3.5–5.2)
ALK PHOS: 47 U/L (ref 39–117)
ALT: 21 U/L (ref 0–35)
AST: 27 U/L (ref 0–37)
BILIRUBIN DIRECT: 0 mg/dL (ref 0.0–0.3)
TOTAL PROTEIN: 7.6 g/dL (ref 6.0–8.3)
Total Bilirubin: 0.5 mg/dL (ref 0.3–1.2)

## 2013-05-06 LAB — BASIC METABOLIC PANEL
BUN: 16 mg/dL (ref 6–23)
CHLORIDE: 104 meq/L (ref 96–112)
CO2: 25 meq/L (ref 19–32)
CREATININE: 1 mg/dL (ref 0.4–1.2)
Calcium: 10.3 mg/dL (ref 8.4–10.5)
GFR: 60.47 mL/min (ref >60.00)
Glucose, Bld: 85 mg/dL (ref 70–99)
POTASSIUM: 4.9 meq/L (ref 3.5–5.1)
Sodium: 141 mEq/L (ref 135–145)

## 2013-05-06 LAB — LIPID PANEL
CHOL/HDL RATIO: 6
CHOLESTEROL: 315 mg/dL — AB (ref 0–200)
HDL: 53 mg/dL (ref >39.00)
Triglycerides: 265 mg/dL — ABNORMAL HIGH (ref 0.0–149.0)
VLDL: 53 mg/dL — AB (ref 0.0–40.0)

## 2013-05-06 LAB — LDL CHOLESTEROL, DIRECT: LDL DIRECT: 228.4 mg/dL

## 2013-05-07 NOTE — Progress Notes (Signed)
Quick Note:  Please make copy of labs for patient visit. ______ 

## 2013-05-10 ENCOUNTER — Encounter: Payer: Self-pay | Admitting: Cardiology

## 2013-05-10 ENCOUNTER — Ambulatory Visit (INDEPENDENT_AMBULATORY_CARE_PROVIDER_SITE_OTHER): Payer: Medicare Other | Admitting: Cardiology

## 2013-05-10 VITALS — BP 122/66 | HR 82 | Ht 63.0 in | Wt 131.0 lb

## 2013-05-10 DIAGNOSIS — R51 Headache: Secondary | ICD-10-CM

## 2013-05-10 DIAGNOSIS — IMO0001 Reserved for inherently not codable concepts without codable children: Secondary | ICD-10-CM

## 2013-05-10 DIAGNOSIS — C189 Malignant neoplasm of colon, unspecified: Secondary | ICD-10-CM

## 2013-05-10 DIAGNOSIS — M797 Fibromyalgia: Secondary | ICD-10-CM

## 2013-05-10 DIAGNOSIS — E785 Hyperlipidemia, unspecified: Secondary | ICD-10-CM

## 2013-05-10 DIAGNOSIS — I059 Rheumatic mitral valve disease, unspecified: Secondary | ICD-10-CM

## 2013-05-10 DIAGNOSIS — E7800 Pure hypercholesterolemia, unspecified: Secondary | ICD-10-CM

## 2013-05-10 DIAGNOSIS — I341 Nonrheumatic mitral (valve) prolapse: Secondary | ICD-10-CM

## 2013-05-10 NOTE — Patient Instructions (Signed)
Your physician recommends that you continue on your current medications as directed. Please refer to the Current Medication list given to you today.  Your physician recommends that you schedule a follow-up appointment in: 4 months with fasting labs (lp/bmet/hfp)  

## 2013-05-10 NOTE — Assessment & Plan Note (Signed)
The patient has not had any evidence of recurrence of her colon cancer.

## 2013-05-10 NOTE — Assessment & Plan Note (Signed)
The patient has lost 6 pounds since her last visit.  Her lipids are significantly improved since last visit.  She is pleased with that.  She has been walking a mile a day 7 days a week.

## 2013-05-10 NOTE — Progress Notes (Signed)
Shelby [name] Date of Birth:  February 12, 1941 [address] [place], [place]  [address] [phone]  Fax   [phone]  HPI: This pleasant 73 year old woman is seen for a scheduled followup office visit. She has a past history of hypercholesterolemia, anemia and suspected mitral valve prolapse. She also has a past history of breast cancer of the right breast with reconstruction. She also more recently in the summer of 2012 on a routine colonoscopy was found to have cancer of the right colon which was asymptomatic and this was successfully removed. All lymph nodes were negative and she did not require any subsequent chemotherapy. She is doing well postop except that she tends to be more prone to diarrhea and loose stools now that this section of her colon has been removed. The patient recently also was complaining of a lot of pain deep in her bones. We obtained a PET scan which did not show any definite cancer but raise the question of an abnormality in her spine. However subsequent MRI did not show any cancer of her spine.  Since last visit the patient has been experiencing more diffuse myalgias which she attributes to her fibromyalgia.  The episodes usually last for several days and she finds that taking a Valium helps with the relief of pain.  The patient has chronic headaches.  She is on gabapentin from the [facility] and is followed by Dr. Domingo Cocking.   The patient has a history of probable fibromyalgia. She has had a trial of Skelaxin. She is also found that the Valium at bedtime helps as much as anything.  She does not want to try Lyrica because of potential side effects. 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   Current Outpatient Prescriptions  Medication Sig Dispense Refill  . amitriptyline (ELAVIL) 50 MG tablet Take 1-2 tablets (50-100 mg total) by mouth at bedtime as needed for sleep (for sleep).  60 tablet  5  . atenolol  (TENORMIN) 25 MG tablet TAKE 1 OR 2 TABLETS DAILY  60 tablet  3  . Calcium Carbonate-Vitamin D (CALCIUM 600 + D PO) Take 1 tablet by mouth 2 (two) times daily.       . diazepam (VALIUM) 5 MG tablet Take 1 tablet (5 mg total) by mouth daily as needed.  30 tablet  5  . gabapentin (NEURONTIN) 100 MG capsule Take 100 mg by mouth 3 (three) times daily as needed.      . gabapentin (NEURONTIN) 600 MG tablet Take 600 mg by mouth at bedtime.       . Tretinoin, Facial Wrinkles, (RENOVA EX) Apply 1 application topically at bedtime.      . vitamin B-12 (CYANOCOBALAMIN) 1000 MCG tablet Take 1,000 mcg by mouth daily.       No current facility-administered medications for this visit.    Allergies  Allergen Reactions  . Latex Rash  . Benzonatate     REACTION: migraines  . Codeine     REACTION: Nausea  . Morphine And Related     itching  . Skelaxin [Metaxalone] Other (See Comments)    "Makes Pt feel crazy"  . Penicillins Rash  . Sulfonamide Derivatives Rash    Patient Active Problem List   Diagnosis Date Noted  . Positional vertigo 07/13/2010    Priority: High  . HYPERLIPIDEMIA 12/14/2007    Priority: High  . MITRAL VALVE PROLAPSE 12/14/2007    Priority: High  . Headache 06/29/2012  .  Colon cancer 11/11/2011  . Fibromyalgia 03/01/2011  . DYSPNEA 01/13/2008  . ALLERGIC RHINITIS 12/14/2007  . ASTHMA, CHILDHOOD 12/14/2007  . COUGH 12/14/2007  . BREAST CANCER, HX OF 12/14/2007    History  Smoking status  . Never Smoker   Smokeless tobacco  . Never Used    History  Alcohol Use  . 0.6 oz/week  . 1 Glasses of wine per week    Family History  Problem Relation Age of Onset  . Arthritis Mother   . Heart disease Father   . Heart attack Father   . Hypertension Father   . Cancer Son     rectal cancer-died age 44  . Diabetes Paternal Aunt   . Breast cancer Paternal Grandfather     Review of Systems: The patient denies any heat or cold intolerance.  No weight gain or weight  loss.  The patient denies headaches or blurry vision.  There is no cough or sputum production.  The patient denies dizziness.  There is no hematuria or hematochezia.  The patient denies any muscle aches or arthritis.  The patient denies any rash.  The patient denies frequent falling or instability.  There is no history of depression or anxiety.  All other systems were reviewed and are negative.   Physical Exam: Filed Vitals:   05/10/13 0807  BP: 122/66  Pulse: 82   the general appearance reveals a well-developed well-nourished woman in no distress.The head and neck exam reveals pupils equal and reactive.  Extraocular movements are full.  There is no scleral icterus.  The mouth and pharynx are normal.  The neck is supple.  The carotids reveal no bruits.  The jugular venous pressure is normal.  The  thyroid is not enlarged.  There is no lymphadenopathy.  The chest is clear to percussion and auscultation.  There are no rales or rhonchi.  Expansion of the chest is symmetrical.  The precordium is quiet.  The first heart sound is normal.  The second heart sound is physiologically split.  There is no murmur gallop rub or click.  There is no abnormal lift or heave.  The abdomen is soft and nontender.  The bowel sounds are normal.  The liver and spleen are not enlarged.  There are no abdominal masses.  There are no abdominal bruits.  Extremities reveal good pedal pulses.  There is no phlebitis or edema.  There is no cyanosis or clubbing.  Strength is normal and symmetrical in all extremities.  There is no lateralizing weakness.  There are no sensory deficits.  The skin is warm and dry.  There is no rash.     Assessment / Plan: Continue on same medication.  Recheck here in 4 months for followup office visit lipid panel hepatic function panel and basal metabolic panel.  Encouraged to continue walking exercise and careful low carbohydrate diet.  They are spending half of the time here and half of the time at  [facility]

## 2013-05-10 NOTE — Assessment & Plan Note (Signed)
Her headaches are still frequent and severe but not quite as stabbing as they were at first

## 2013-05-10 NOTE — Assessment & Plan Note (Signed)
The patient has not been having a palpitations or chest pain from her mitral valve prolapse.

## 2013-07-02 ENCOUNTER — Other Ambulatory Visit: Payer: Self-pay | Admitting: Cardiology

## 2013-07-02 DIAGNOSIS — F32A Depression, unspecified: Secondary | ICD-10-CM

## 2013-07-02 DIAGNOSIS — F329 Major depressive disorder, single episode, unspecified: Secondary | ICD-10-CM

## 2013-07-05 ENCOUNTER — Other Ambulatory Visit: Payer: Self-pay | Admitting: Cardiology

## 2013-07-05 DIAGNOSIS — F419 Anxiety disorder, unspecified: Secondary | ICD-10-CM

## 2013-07-08 ENCOUNTER — Other Ambulatory Visit: Payer: Self-pay | Admitting: *Deleted

## 2013-08-30 ENCOUNTER — Other Ambulatory Visit (INDEPENDENT_AMBULATORY_CARE_PROVIDER_SITE_OTHER): Payer: Medicare Other

## 2013-08-30 DIAGNOSIS — E7800 Pure hypercholesterolemia, unspecified: Secondary | ICD-10-CM

## 2013-08-30 LAB — BASIC METABOLIC PANEL
BUN: 11 mg/dL (ref 6–23)
CALCIUM: 9.7 mg/dL (ref 8.4–10.5)
CO2: 30 mEq/L (ref 19–32)
Chloride: 102 mEq/L (ref 96–112)
Creatinine, Ser: 0.8 mg/dL (ref 0.4–1.2)
GFR: 70.48 mL/min (ref >60.00)
Glucose, Bld: 95 mg/dL (ref 70–99)
POTASSIUM: 4.3 meq/L (ref 3.5–5.1)
SODIUM: 139 meq/L (ref 135–145)

## 2013-08-30 LAB — HEPATIC FUNCTION PANEL
ALBUMIN: 4.3 g/dL (ref 3.5–5.2)
ALT: 26 U/L (ref 0–35)
AST: 33 U/L (ref 0–37)
Alkaline Phosphatase: 51 U/L (ref 39–117)
BILIRUBIN TOTAL: 0.5 mg/dL (ref 0.2–1.2)
Bilirubin, Direct: 0.1 mg/dL (ref 0.0–0.3)
TOTAL PROTEIN: 7.2 g/dL (ref 6.0–8.3)

## 2013-08-30 LAB — LIPID PANEL
CHOLESTEROL: 272 mg/dL — AB (ref 0–200)
HDL: 43 mg/dL (ref >39.00)
LDL CALC: 169 mg/dL — AB (ref 0–99)
NonHDL: 229
Total CHOL/HDL Ratio: 6
Triglycerides: 301 mg/dL — ABNORMAL HIGH (ref 0.0–149.0)
VLDL: 60.2 mg/dL — AB (ref 0.0–40.0)

## 2013-08-30 NOTE — Progress Notes (Signed)
Quick Note:  Please make copy of labs for patient visit. ______ 

## 2013-09-02 ENCOUNTER — Ambulatory Visit (INDEPENDENT_AMBULATORY_CARE_PROVIDER_SITE_OTHER): Payer: Medicare Other | Admitting: Cardiology

## 2013-09-02 ENCOUNTER — Encounter: Payer: Self-pay | Admitting: Cardiology

## 2013-09-02 ENCOUNTER — Other Ambulatory Visit: Payer: Medicare Other

## 2013-09-02 VITALS — BP 124/78 | HR 76 | Ht 63.0 in | Wt 133.0 lb

## 2013-09-02 DIAGNOSIS — E785 Hyperlipidemia, unspecified: Secondary | ICD-10-CM

## 2013-09-02 DIAGNOSIS — R51 Headache: Secondary | ICD-10-CM

## 2013-09-02 DIAGNOSIS — E7800 Pure hypercholesterolemia, unspecified: Secondary | ICD-10-CM

## 2013-09-02 DIAGNOSIS — R0602 Shortness of breath: Secondary | ICD-10-CM

## 2013-09-02 DIAGNOSIS — I059 Rheumatic mitral valve disease, unspecified: Secondary | ICD-10-CM

## 2013-09-02 DIAGNOSIS — I341 Nonrheumatic mitral (valve) prolapse: Secondary | ICD-10-CM

## 2013-09-02 DIAGNOSIS — M797 Fibromyalgia: Secondary | ICD-10-CM

## 2013-09-02 DIAGNOSIS — IMO0001 Reserved for inherently not codable concepts without codable children: Secondary | ICD-10-CM

## 2013-09-02 NOTE — Progress Notes (Signed)
Shelby [name] Date of Birth:  [birth year] [facility] [address] [place], [place]  [address] [phone]  Fax   [phone]  HPI: This pleasant 73 year old woman is seen for a scheduled followup office visit. She has a past history of hypercholesterolemia, anemia and suspected mitral valve prolapse. She also has a past history of breast cancer of the right breast with reconstruction. She also more recently in the summer of 2012 on a routine colonoscopy was found to have cancer of the right colon which was asymptomatic and this was successfully removed. All lymph nodes were negative and she did not require any subsequent chemotherapy. She is doing well postop except that she tends to be more prone to diarrhea and loose stools now that this section of her colon has been removed. The patient recently also was complaining of a lot of pain deep in her bones. We obtained a PET scan which did not show any definite cancer but raise the question of an abnormality in her spine. However subsequent MRI did not show any cancer of her spine. Since last visit the patient has been experiencing more diffuse myalgias which she attributes to her fibromyalgia. The episodes usually last for several days and she finds that taking a Valium helps with the relief of pain. The patient has chronic headaches. She is on gabapentin from the [facility] and is followed by Dr. Domingo Cocking.  Since last visit she has been doing better with her exercise.  She walks 1.1 miles every day   Current Outpatient Prescriptions  Medication Sig Dispense Refill  . amitriptyline (ELAVIL) 50 MG tablet TAKE 1 OR 2 TABLETS AT BEDTIME AS NEEDED.  60 tablet  5  . atenolol (TENORMIN) 25 MG tablet TAKE 1 OR 2 TABLETS DAILY  60 tablet  3  . Calcium Carbonate-Vitamin D (CALCIUM 600 + D PO) Take 1 tablet by mouth 2 (two) times daily.       Marland Kitchen gabapentin (NEURONTIN) 100 MG capsule Take 100 mg by mouth 3 (three) times daily as  needed.      . gabapentin (NEURONTIN) 600 MG tablet Take 900 mg by mouth at bedtime.       . Tretinoin, Facial Wrinkles, (RENOVA EX) Apply 1 application topically at bedtime.      Marland Kitchen VALIUM 5 MG tablet TAKE (1) TABLET DAILY AS NEEDED.  30 tablet  5  . vitamin B-12 (CYANOCOBALAMIN) 1000 MCG tablet Take 1,000 mcg by mouth daily.       No current facility-administered medications for this visit.    Allergies  Allergen Reactions  . Latex Rash  . Benzonatate     REACTION: migraines  . Codeine     REACTION: Nausea  . Morphine And Related     itching  . Skelaxin [Metaxalone] Other (See Comments)    "Makes Pt feel crazy"  . Penicillins Rash  . Sulfonamide Derivatives Rash    Patient Active Problem List   Diagnosis Date Noted  . Positional vertigo 07/13/2010    Priority: High  . HYPERLIPIDEMIA 12/14/2007    Priority: High  . MITRAL VALVE PROLAPSE 12/14/2007    Priority: High  . Headache 06/29/2012  . Colon cancer 11/11/2011  . Fibromyalgia 03/01/2011  . DYSPNEA 01/13/2008  . ALLERGIC RHINITIS 12/14/2007  . ASTHMA, CHILDHOOD 12/14/2007  . COUGH 12/14/2007  . BREAST CANCER, HX OF 12/14/2007    History  Smoking status  . Never Smoker   Smokeless tobacco  .  Never Used    History  Alcohol Use  . 0.6 oz/week  . 1 Glasses of wine per week    Family History  Problem Relation Age of Onset  . Arthritis Mother   . Heart disease Father   . Heart attack Father   . Hypertension Father   . Cancer Son     rectal cancer-died age 39  . Diabetes Paternal Aunt   . Breast cancer Paternal Grandfather     Review of Systems: The patient denies any heat or cold intolerance.  No weight gain or weight loss.  The patient denies headaches or blurry vision.  There is no cough or sputum production.  The patient denies dizziness.  There is no hematuria or hematochezia.  The patient denies any muscle aches or arthritis.  The patient denies any rash.  The patient denies frequent falling or  instability.  There is no history of depression or anxiety.  All other systems were reviewed and are negative.   Physical Exam: Filed Vitals:   09/02/13 0831  BP: 124/78  Pulse: 76   the general appearance reveals a well-developed well-nourished woman in no distress.The head and neck exam reveals pupils equal and reactive.  Extraocular movements are full.  There is no scleral icterus.  The mouth and pharynx are normal.  The neck is supple.  The carotids reveal no bruits.  The jugular venous pressure is normal.  The  thyroid is not enlarged.  There is no lymphadenopathy.  The chest is clear to percussion and auscultation.  There are no rales or rhonchi.  Expansion of the chest is symmetrical.  The precordium is quiet.  The first heart sound is normal.  The second heart sound is physiologically split.  There is no murmur.  There is a faint apical click.  There is no abnormal lift or heave.  The abdomen is soft and nontender.  The bowel sounds are normal.  The liver and spleen are not enlarged.  There are no abdominal masses.  There are no abdominal bruits.  Extremities reveal good pedal pulses.  There is no phlebitis or edema.  There is no cyanosis or clubbing.  Strength is normal and symmetrical in all extremities.  There is no lateralizing weakness.  There are no sensory deficits.  The skin is warm and dry.  There is no rash.      Assessment / Plan: 1.  Mitral valve prolapse suspected 2. Hypercholesterolemia 3. Fibromyalgia 4. history of breast cancer 5. history of colon cancer 6.  Idiopathic stabbing migraine headache syndrome followed at the [facility] by Dr. Domingo Cocking  Plan: Watch diet more carefully in terms of sweets and carbohydrates.  Recheck in 4 months for followup office visit lipid panel hepatic function panel and basal metabolic panel.

## 2013-09-02 NOTE — Assessment & Plan Note (Signed)
The patient has not been experiencing any chest pain or palpitations

## 2013-09-02 NOTE — Assessment & Plan Note (Signed)
She has a history of dyslipidemia.  She has not been to careful about limiting her carbohydrates.  She has been eating some candy.  Her triglycerides are elevated.  Her weight is up 2 pounds since last visit.  She will work harder on diet and weight loss

## 2013-09-02 NOTE — Patient Instructions (Signed)
Your physician recommends that you continue on your current medications as directed. Please refer to the Current Medication list given to you today.   Your physician wants you to follow-up in: 4 months with fasting labs (lp/bmet/hfp)   You will receive a reminder letter in the mail two months in advance. If you don't receive a letter, please call our office to schedule the follow-up appointment @ [phone]

## 2013-09-02 NOTE — Assessment & Plan Note (Signed)
Her exercise tolerance has improved and she is not experiencing any dyspnea.  She walks more than a mile a day for exercise

## 2013-09-27 ENCOUNTER — Other Ambulatory Visit: Payer: Self-pay

## 2013-09-27 DIAGNOSIS — Z1231 Encounter for screening mammogram for malignant neoplasm of breast: Secondary | ICD-10-CM

## 2013-10-26 ENCOUNTER — Other Ambulatory Visit: Payer: Self-pay

## 2013-10-26 ENCOUNTER — Ambulatory Visit
Admission: RE | Admit: 2013-10-26 | Discharge: 2013-10-26 | Disposition: A | Discharge status: Home / Self Care | Payer: Medicare Other | Source: Ambulatory Visit

## 2013-10-26 DIAGNOSIS — Z1231 Encounter for screening mammogram for malignant neoplasm of breast: Secondary | ICD-10-CM

## 2013-11-27 ENCOUNTER — Other Ambulatory Visit: Payer: Self-pay | Admitting: Cardiology

## 2013-11-29 ENCOUNTER — Other Ambulatory Visit: Payer: Self-pay

## 2013-12-23 ENCOUNTER — Other Ambulatory Visit (INDEPENDENT_AMBULATORY_CARE_PROVIDER_SITE_OTHER): Payer: Medicare Other | Admitting: *Deleted

## 2013-12-23 DIAGNOSIS — M797 Fibromyalgia: Secondary | ICD-10-CM

## 2013-12-23 DIAGNOSIS — I341 Nonrheumatic mitral (valve) prolapse: Secondary | ICD-10-CM

## 2013-12-23 DIAGNOSIS — E7800 Pure hypercholesterolemia, unspecified: Secondary | ICD-10-CM

## 2013-12-23 DIAGNOSIS — Z79899 Other long term (current) drug therapy: Secondary | ICD-10-CM

## 2013-12-23 LAB — HEPATIC FUNCTION PANEL
ALT: 22 U/L (ref 0–35)
AST: 30 U/L (ref 0–37)
Albumin: 3.8 g/dL (ref 3.5–5.2)
Alkaline Phosphatase: 52 U/L (ref 39–117)
BILIRUBIN TOTAL: 0.5 mg/dL (ref 0.2–1.2)
Bilirubin, Direct: 0 mg/dL (ref 0.0–0.3)
TOTAL PROTEIN: 7.4 g/dL (ref 6.0–8.3)

## 2013-12-23 LAB — BASIC METABOLIC PANEL
BUN: 16 mg/dL (ref 6–23)
CALCIUM: 9.9 mg/dL (ref 8.4–10.5)
CO2: 29 mEq/L (ref 19–32)
Chloride: 101 mEq/L (ref 96–112)
Creatinine, Ser: 0.9 mg/dL (ref 0.4–1.2)
GFR: 62.61 mL/min (ref >60.00)
GLUCOSE: 84 mg/dL (ref 70–99)
POTASSIUM: 4.2 meq/L (ref 3.5–5.1)
SODIUM: 138 meq/L (ref 135–145)

## 2013-12-23 LAB — LIPID PANEL
CHOLESTEROL: 299 mg/dL — AB (ref 0–200)
HDL: 42.4 mg/dL (ref >39.00)
NonHDL: 256.6
TRIGLYCERIDES: 324 mg/dL — AB (ref 0.0–149.0)
Total CHOL/HDL Ratio: 7
VLDL: 64.8 mg/dL — AB (ref 0.0–40.0)

## 2013-12-23 LAB — LDL CHOLESTEROL, DIRECT: LDL DIRECT: 187.1 mg/dL

## 2013-12-23 NOTE — Progress Notes (Signed)
Quick Note:  Please make copy of labs for patient visit. ______ 

## 2013-12-29 ENCOUNTER — Other Ambulatory Visit: Payer: Medicare Other

## 2013-12-31 ENCOUNTER — Other Ambulatory Visit: Payer: Self-pay

## 2013-12-31 ENCOUNTER — Other Ambulatory Visit: Payer: Medicare Other

## 2014-01-03 ENCOUNTER — Ambulatory Visit (INDEPENDENT_AMBULATORY_CARE_PROVIDER_SITE_OTHER): Payer: Medicare Other | Admitting: Cardiology

## 2014-01-03 ENCOUNTER — Encounter: Payer: Self-pay | Admitting: Cardiology

## 2014-01-03 VITALS — BP 120/74 | HR 73 | Ht 63.0 in | Wt 136.0 lb

## 2014-01-03 DIAGNOSIS — M797 Fibromyalgia: Secondary | ICD-10-CM

## 2014-01-03 DIAGNOSIS — E7800 Pure hypercholesterolemia, unspecified: Secondary | ICD-10-CM

## 2014-01-03 DIAGNOSIS — I341 Nonrheumatic mitral (valve) prolapse: Secondary | ICD-10-CM

## 2014-01-03 DIAGNOSIS — C189 Malignant neoplasm of colon, unspecified: Secondary | ICD-10-CM

## 2014-01-03 NOTE — Assessment & Plan Note (Signed)
Her fibromyalgia has been worse since the weather has turned cold. The patient is making an effort to walk every day for exercise

## 2014-01-03 NOTE — Patient Instructions (Signed)
Your physician recommends that you continue on your current medications as directed. Please refer to the Current Medication list given to you today.  Your physician wants you to follow-up in: 4 months with fasting labs (lp/bmet/hfp) You will receive a reminder letter in the mail two months in advance. If you don't receive a letter, please call our office to schedule the follow-up appointment.  

## 2014-01-03 NOTE — Assessment & Plan Note (Signed)
She is being followed by gastroenterology.  Currently she is on every three-year colonoscopy schedule.  She has not been aware of any hematochezia or melena.

## 2014-01-03 NOTE — Assessment & Plan Note (Signed)
Patient has a past history of mitral valve prolapse.  She has not been having palpitations or chest pain.

## 2014-01-03 NOTE — Progress Notes (Signed)
Shelby [name] Date of Birth:  12/24/1940 [facility] [address] [place], [place]  [address] [phone]  Fax   [phone]  HPI: This pleasant 73 year old woman is seen for a scheduled followup office visit. She has a past history of hypercholesterolemia, anemia and suspected mitral valve prolapse. She also has a past history of breast cancer of the right breast with reconstruction. She also  in the summer of 2012 on a routine colonoscopy was found to have cancer of the right colon which was asymptomatic and this was successfully removed. All lymph nodes were negative and she did not require any subsequent chemotherapy. She is doing well postop except that she tends to be more prone to diarrhea and loose stools now that this section of her colon has been removed. The patient recently also was complaining of a lot of pain deep in her bones. We obtained a PET scan which did not show any definite cancer but raise the question of an abnormality in her spine. However subsequent MRI did not show any cancer of her spine. Since last visit the patient has been experiencing more diffuse myalgias which she attributes to her fibromyalgia. The episodes usually last for several days and she finds that taking a Valium helps with the relief of pain. The patient has chronic headaches. She is on gabapentin from the [facility] and is followed by Dr. Domingo Cocking.  Since last visit she has been doing better with her exercise.  She walks 1.1 miles every day.  She had a recent colonoscopy.  She will not need another one for 3 years. She is prior total hysterectomy.   Current Outpatient Prescriptions  Medication Sig Dispense Refill  . amitriptyline (ELAVIL) 50 MG tablet TAKE 1 OR 2 TABLETS AT BEDTIME AS NEEDED.  60 tablet  5  . atenolol (TENORMIN) 25 MG tablet TAKE 1 OR 2 TABLETS DAILY  60 tablet  3  . Calcium Carbonate-Vitamin D (CALCIUM 600 + D PO) Take 1 tablet by mouth 2 (two) times  daily.       Marland Kitchen gabapentin (NEURONTIN) 100 MG capsule Take 100 mg by mouth 3 (three) times daily as needed.      . gabapentin (NEURONTIN) 600 MG tablet Take 900 mg by mouth at bedtime.       . Tretinoin, Facial Wrinkles, (RENOVA EX) Apply 1 application topically at bedtime.      Marland Kitchen VALIUM 5 MG tablet TAKE (1) TABLET DAILY AS NEEDED.  30 tablet  5  . vitamin B-12 (CYANOCOBALAMIN) 1000 MCG tablet Take 1,000 mcg by mouth daily.       No current facility-administered medications for this visit.    Allergies  Allergen Reactions  . Latex Rash  . Benzonatate     REACTION: migraines  . Codeine     REACTION: Nausea  . Morphine And Related     itching  . Skelaxin [Metaxalone] Other (See Comments)    "Makes Pt feel crazy"  . Penicillins Rash  . Sulfonamide Derivatives Rash    Patient Active Problem List   Diagnosis Date Noted  . Positional vertigo 07/13/2010    Priority: High  . HYPERLIPIDEMIA 12/14/2007    Priority: High  . MITRAL VALVE PROLAPSE 12/14/2007    Priority: High  . Headache 06/29/2012  . Colon cancer 11/11/2011  . Fibromyalgia 03/01/2011  . DYSPNEA 01/13/2008  . ALLERGIC RHINITIS 12/14/2007  . ASTHMA, CHILDHOOD 12/14/2007  . COUGH 12/14/2007  . BREAST CANCER,  HX OF 12/14/2007    History  Smoking status  . Never Smoker   Smokeless tobacco  . Never Used    History  Alcohol Use  . 0.6 oz/week  . 1 Glasses of wine per week    Family History  Problem Relation Age of Onset  . Arthritis Mother   . Heart disease Father   . Heart attack Father   . Hypertension Father   . Cancer Son     rectal cancer-died age 48  . Diabetes Paternal Aunt   . Breast cancer Paternal Grandfather     Review of Systems: The patient denies any heat or cold intolerance.  No weight gain or weight loss.  The patient denies headaches or blurry vision.  There is no cough or sputum production.  The patient denies dizziness.  There is no hematuria or hematochezia.  The patient denies any  muscle aches or arthritis.  The patient denies any rash.  The patient denies frequent falling or instability.  There is no history of depression or anxiety.  All other systems were reviewed and are negative.   Physical Exam: Filed Vitals:   01/03/14 0908  BP: 120/74  Pulse: 73   the general appearance reveals a well-developed well-nourished woman in no distress.The head and neck exam reveals pupils equal and reactive.  Extraocular movements are full.  There is no scleral icterus.  The mouth and pharynx are normal.  The neck is supple.  The carotids reveal no bruits.  The jugular venous pressure is normal.  The  thyroid is not enlarged.  There is no lymphadenopathy.  The chest is clear to percussion and auscultation.  There are no rales or rhonchi.  Expansion of the chest is symmetrical.  The precordium is quiet.  The first heart sound is normal.  The second heart sound is physiologically split.  There is no murmur.  There is a faint apical click.  There is no abnormal lift or heave.  The abdomen is soft and nontender.  The bowel sounds are normal.  The liver and spleen are not enlarged.  There are no abdominal masses.  There are no abdominal bruits.  Extremities reveal good pedal pulses.  There is no phlebitis or edema.  There is no cyanosis or clubbing.  Strength is normal and symmetrical in all extremities.  There is no lateralizing weakness.  There are no sensory deficits.  The skin is warm and dry.  There is no rash.      Assessment / Plan: 1.  Mitral valve prolapse suspected 2. Hypercholesterolemia 3. Fibromyalgia 4. history of breast cancer of right breast with reconstruction 5. history of colon cancer 6.  Idiopathic stabbing migraine headache syndrome followed at the headache Center by Dr. Domingo Cocking  Plan: Watch diet more carefully in terms of sweets and carbohydrates.  Recheck in 4 months for followup office visit lipid panel hepatic function panel and basal metabolic panel.

## 2014-01-03 NOTE — Assessment & Plan Note (Signed)
The patient has a past history of hypercholesterolemia.  She is intolerant to all of the cholesterol-lowering medications we have tried.  She is trying to control her lipids with diet and exercise.  Unfortunately her weight is up 3 pounds since last visit.

## 2014-02-16 ENCOUNTER — Other Ambulatory Visit: Payer: Self-pay | Admitting: Cardiology

## 2014-02-16 ENCOUNTER — Telehealth: Payer: Self-pay | Admitting: Cardiology

## 2014-02-16 DIAGNOSIS — R238 Other skin changes: Secondary | ICD-10-CM

## 2014-02-16 NOTE — Telephone Encounter (Signed)
New Msg   Spoke with pt and she stated she doesn't get flu shots.

## 2014-04-07 ENCOUNTER — Telehealth: Payer: Self-pay | Admitting: Cardiology

## 2014-04-07 NOTE — Telephone Encounter (Signed)
Patient phoned c/o of hip pain since before Christmas Patient fell in September on hip and thought she was ok  Advised to call [place], call back if unable to get appointment  Verbalized understanding

## 2014-04-07 NOTE — Telephone Encounter (Signed)
New Msg        Pt calling, no details given.   Please return call.

## 2014-04-14 DIAGNOSIS — M7071 Other bursitis of hip, right hip: Secondary | ICD-10-CM | POA: Diagnosis not present

## 2014-04-14 DIAGNOSIS — M545 Low back pain: Secondary | ICD-10-CM | POA: Diagnosis not present

## 2014-04-14 DIAGNOSIS — M5136 Other intervertebral disc degeneration, lumbar region: Secondary | ICD-10-CM | POA: Diagnosis not present

## 2014-05-09 ENCOUNTER — Other Ambulatory Visit (INDEPENDENT_AMBULATORY_CARE_PROVIDER_SITE_OTHER): Payer: Medicare Other | Admitting: *Deleted

## 2014-05-09 DIAGNOSIS — C189 Malignant neoplasm of colon, unspecified: Secondary | ICD-10-CM | POA: Diagnosis not present

## 2014-05-09 DIAGNOSIS — E7800 Pure hypercholesterolemia, unspecified: Secondary | ICD-10-CM

## 2014-05-09 DIAGNOSIS — I341 Nonrheumatic mitral (valve) prolapse: Secondary | ICD-10-CM

## 2014-05-09 DIAGNOSIS — M797 Fibromyalgia: Secondary | ICD-10-CM | POA: Diagnosis not present

## 2014-05-09 LAB — LIPID PANEL
Cholesterol: 304 mg/dL — ABNORMAL HIGH (ref 0–200)
HDL: 49.3 mg/dL (ref >39.00)
NonHDL: 254.7
Total CHOL/HDL Ratio: 6
Triglycerides: 312 mg/dL — ABNORMAL HIGH (ref 0.0–149.0)
VLDL: 62.4 mg/dL — ABNORMAL HIGH (ref 0.0–40.0)

## 2014-05-09 LAB — BASIC METABOLIC PANEL
BUN: 13 mg/dL (ref 6–23)
CO2: 28 mEq/L (ref 19–32)
Calcium: 9.7 mg/dL (ref 8.4–10.5)
Chloride: 105 mEq/L (ref 96–112)
Creatinine, Ser: 0.89 mg/dL (ref 0.40–1.20)
GFR: 65.8 mL/min (ref >60.00)
Glucose, Bld: 81 mg/dL (ref 70–99)
Potassium: 4.3 mEq/L (ref 3.5–5.1)
Sodium: 140 mEq/L (ref 135–145)

## 2014-05-09 LAB — HEPATIC FUNCTION PANEL
ALT: 17 U/L (ref 0–35)
AST: 23 U/L (ref 0–37)
Albumin: 3.9 g/dL (ref 3.5–5.2)
Alkaline Phosphatase: 50 U/L (ref 39–117)
Bilirubin, Direct: 0 mg/dL (ref 0.0–0.3)
Total Bilirubin: 0.3 mg/dL (ref 0.2–1.2)
Total Protein: 6.7 g/dL (ref 6.0–8.3)

## 2014-05-09 LAB — LDL CHOLESTEROL, DIRECT: Direct LDL: 200 mg/dL

## 2014-05-10 ENCOUNTER — Ambulatory Visit: Payer: Medicare Other | Admitting: Cardiology

## 2014-05-10 NOTE — Progress Notes (Signed)
Quick Note:  Please make copy of labs for patient visit. ______ 

## 2014-05-11 ENCOUNTER — Ambulatory Visit (INDEPENDENT_AMBULATORY_CARE_PROVIDER_SITE_OTHER): Payer: Medicare Other | Admitting: Cardiology

## 2014-05-11 ENCOUNTER — Encounter: Payer: Self-pay | Admitting: Cardiology

## 2014-05-11 VITALS — BP 112/70 | HR 74 | Ht 63.0 in | Wt 135.8 lb

## 2014-05-11 DIAGNOSIS — M25551 Pain in right hip: Secondary | ICD-10-CM | POA: Insufficient documentation

## 2014-05-11 DIAGNOSIS — M797 Fibromyalgia: Secondary | ICD-10-CM

## 2014-05-11 DIAGNOSIS — I341 Nonrheumatic mitral (valve) prolapse: Secondary | ICD-10-CM

## 2014-05-11 DIAGNOSIS — E7800 Pure hypercholesterolemia, unspecified: Secondary | ICD-10-CM

## 2014-05-11 NOTE — Progress Notes (Signed)
Cardiology Office Note   Date:  05/11/2014   ID:  Jhene, [place]moreland 1940-11-13, MRN [id number]  PCP:  Darlin Coco, MD  Cardiologist:   Darlin Coco, MD   No chief complaint on file.     History of Present Illness: Shelby [name] is a 74 y.o. female who presents for a scheduled follow-up office visit  This pleasant 74 year old woman is seen for a scheduled followup office visit. She has a past history of hypercholesterolemia, anemia and suspected mitral valve prolapse. She also has a past history of breast cancer of the right breast with reconstruction. She also in the summer of 2012 on a routine colonoscopy was found to have cancer of the right colon which was asymptomatic and this was successfully removed. All lymph nodes were negative and she did not require any subsequent chemotherapy. She is doing well postop except that she tends to be more prone to diarrhea and loose stools now that this section of her colon has been removed. The patient recently also was complaining of a lot of pain deep in her bones. We obtained a PET scan which did not show any definite cancer but raise the question of an abnormality in her spine. However subsequent MRI did not show any cancer of her spine. Since last visit the patient has been experiencing more diffuse myalgias which she attributes to her fibromyalgia. The episodes usually last for several days and she finds that taking a Valium helps with the relief of pain. The patient has chronic headaches. She is on gabapentin from the headache Center and is followed by Dr. Domingo Cocking. Since last visit she has been doing better with her exercise. She walks 1.1 miles every day. She had a recent colonoscopy. She will not need another one for 3 years. She is prior total hysterectomy. In October she fell down some steps.  She injured her right hip.  She did not go to see a physician initially.  She eventually went to see Dr. Maxie Better, orthopedist, who  diagnosed a cold tendon in the right hip.  She has had a cortisone shot and also a round of Medrol Dosepak which is helped. The patient has not been having any chest pain.  She has been less physically active because of the hip injury.  She has had occasional orthostatic dizziness when she stands up.  Her blood pressure tends to be low and we are recommending that she increase her dietary salt intake slightly. Past Medical History  Diagnosis Date  . MVP (mitral valve prolapse)   . Eosinophilia   . Syncope     secondary to vasovagal phenomenon  . Hypercholesterolemia     statin intolerant  . Ovarian cyst   . Heart murmur     mitral valve prolapse  . Depression   . Breast cancer 2001    s/p surgery and chemo   . Colon cancer 2012  . Allergy   . Arthritis   . Asthma   . Migraine     Past Surgical History  Procedure Laterality Date  . Mastectomy  2001  . Colonscopy    . Back surgery  0962,8366  . Shoulder surgery  2000  . Spine surgery    . Cosmetic surgery      face lift  . Breast surgery      2000-mastectomy and TRAM with mesh  . Vaginal hysterectomy  1979  . Appendectomy  1970  . Laparoscopic salpingoopherectomy  8.29.12  LAP.W/BSO    . Colon surgery    . Rotator cuff repair    . Artery biopsy Left 06/10/2012    Procedure: LEFT BIOPSY TEMPORAL ARTERY;  Surgeon: Harl Bowie, MD;  Location: [facility];  Service: General;  Laterality: Left;     Current Outpatient Prescriptions  Medication Sig Dispense Refill  . amitriptyline (ELAVIL) 50 MG tablet TAKE 1 OR 2 TABLETS AT BEDTIME AS NEEDED. 60 tablet 5  . atenolol (TENORMIN) 25 MG tablet TAKE 1 OR 2 TABLETS DAILY 60 tablet 3  . Calcium Carbonate-Vitamin D (CALCIUM 600 + D PO) Take 1 tablet by mouth 2 (two) times daily.     Marland Kitchen gabapentin (NEURONTIN) 100 MG capsule Take 100 mg by mouth 3 (three) times daily as needed.    Marland Kitchen GABAPENTIN, ONCE-DAILY, PO Take 900 mg by mouth at bedtime.    . Tretinoin, Facial Wrinkles, (RENOVA  EX) Apply 1 application topically at bedtime.    . Tretinoin, Facial Wrinkles, (TRETINOIN, EMOLLIENT,) 0.05 % CREA APPLY AT BEDTIME 40 g 3  . VALIUM 5 MG tablet TAKE (1) TABLET DAILY AS NEEDED. 30 tablet 5  . vitamin B-12 (CYANOCOBALAMIN) 1000 MCG tablet Take 1,000 mcg by mouth daily.     No current facility-administered medications for this visit.    Allergies:   Latex; Benzonatate; Codeine; Morphine and related; Skelaxin; Penicillins; and Sulfonamide derivatives    Social History:  The patient  reports that she has never smoked. She has never used smokeless tobacco. She reports that she drinks about 0.6 oz of alcohol per week. She reports that she does not use illicit drugs.   Family History:  The patient's family history includes Arthritis in her mother; Breast cancer in her paternal grandfather; Cancer in her son; Diabetes in her paternal aunt; Heart attack in her father; Heart disease in her father; Hypertension in her father.    ROS:  Please see the history of present illness.   Otherwise, review of systems are positive for none.   All other systems are reviewed and negative.    PHYSICAL EXAM: VS:  BP 112/70 mmHg  Pulse 74  Ht 5\' 3"  (1.6 m)  Wt 135 lb 12.8 oz (61.598 kg)  BMI 24.06 kg/m2 , BMI Body mass index is 24.06 kg/(m^2). GEN: Well nourished, well developed, in no acute distress HEENT: normal Neck: no JVD, carotid bruits, or masses Cardiac: RRR; no murmurs, rubs, or gallops,no edema.  Faint midsystolic click. Respiratory:  clear to auscultation bilaterally, normal work of breathing GI: soft, nontender, nondistended, + BS MS: no deformity or atrophy Skin: warm and dry, no rash Neuro:  Strength and sensation are intact Psych: euthymic mood, full affect   EKG:  EKG is ordered today. The ekg ordered today demonstrates normal sinus rhythm with low voltage QRS and no ischemic changes.   Recent Labs: 05/09/2014: ALT 17; BUN 13; Creatinine 0.89; Potassium 4.3; Sodium 140      Lipid Panel    Component Value Date/Time   CHOL 304* 05/09/2014 0828   TRIG 312.0* 05/09/2014 0828   HDL 49.30 05/09/2014 0828   CHOLHDL 6 05/09/2014 0828   VLDL 62.4* 05/09/2014 0828   LDLCALC 169* 08/30/2013 0914   LDLDIRECT 200.0 05/09/2014 0828      Wt Readings from Last 3 Encounters:  05/11/14 135 lb 12.8 oz (61.598 kg)  01/03/14 136 lb (61.689 kg)  09/02/13 133 lb (60.328 kg)        ASSESSMENT AND PLAN:  1. Mitral  valve prolapse suspected 2. Hypercholesterolemia 3. Fibromyalgia 4. history of breast cancer of right breast with reconstruction 5. history of colon cancer 6. Idiopathic stabbing migraine headache syndrome followed at the headache [facility] by Dr. Domingo Cocking 7.  Recent  fall with resultant right hip injury, followed by Dr. Maxie Better 8.  Mild orthostatic dizziness when she stands up, particularly in the morning.  She will try increasing her dietary salt intake slightly   Current medicines are reviewed at length with the patient today.  The patient does not have concerns regarding medicines.  The following changes have been made:  no change  Labs/ tests ordered today include:   Orders Placed This Encounter  Procedures  . Lipid panel  . Hepatic function panel  . Basic metabolic panel  . EKG 12-Lead     Disposition:   FU with Dr. Mare Ferrari in 4 months for office visit, lipid panel hepatic function panel and basal metabolic panel. Continue current medication.  Continue careful diet.   Signed, Darlin Coco, MD  05/11/2014 12:48 PM    [facility] [place], [place], [place]  [address] Phone: [phone]; Fax: [phone]

## 2014-05-11 NOTE — Patient Instructions (Signed)
Your physician recommends that you continue on your current medications as directed. Please refer to the Current Medication list given to you today.  Your physician recommends that you schedule a follow-up appointment in: 4 months with fasting labs (lp/bmet/hfp)  

## 2014-05-24 IMAGING — CT CT HEAD W/O CM
1 of 2 series · 16 of 30 positions shown, 20 images · non-contrast
Comparison: None.

CLINICAL DATA: Sharp headache

CT HEAD WITHOUT CONTRAST
TECHNIQUE: Contiguous axial images were obtained from the base of
the skull through the vertex without contrast.

[Series 3: recon 2: brain · axial · 0.47mm/px · z∈[+115,+241]mm · 16 of 56 slices shown, 20 images]
[im 3/56  brain]
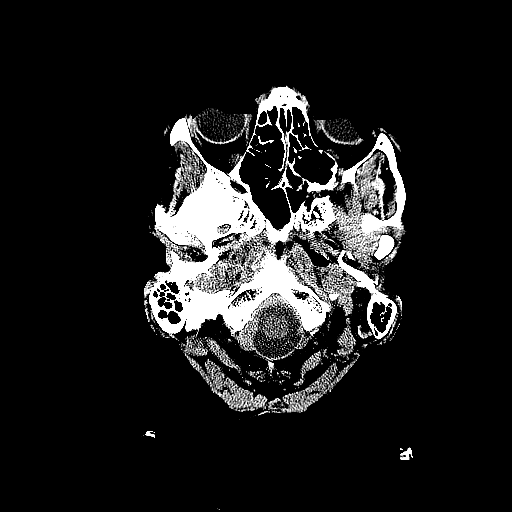
[im 3/56  bone]
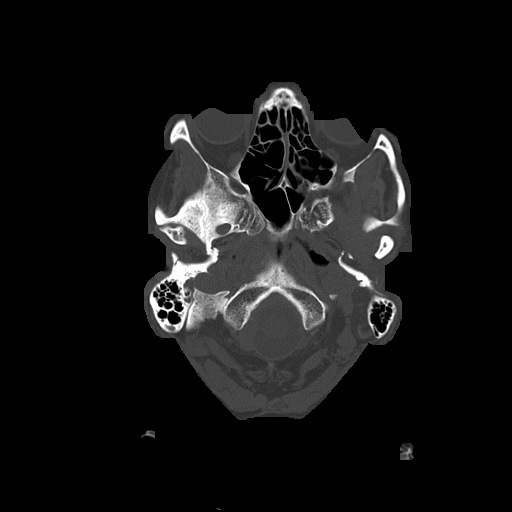
[im 6/56  brain]
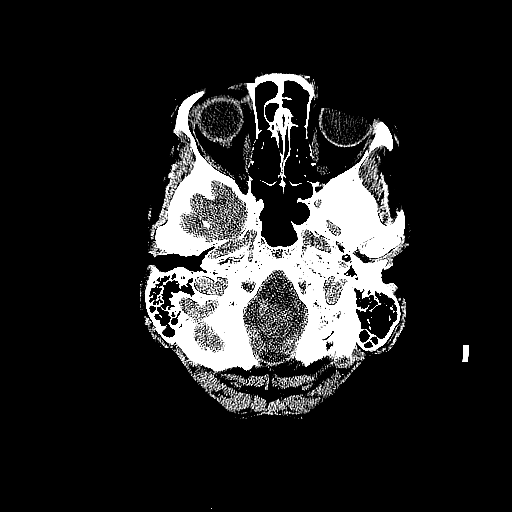
[im 9/56  brain]
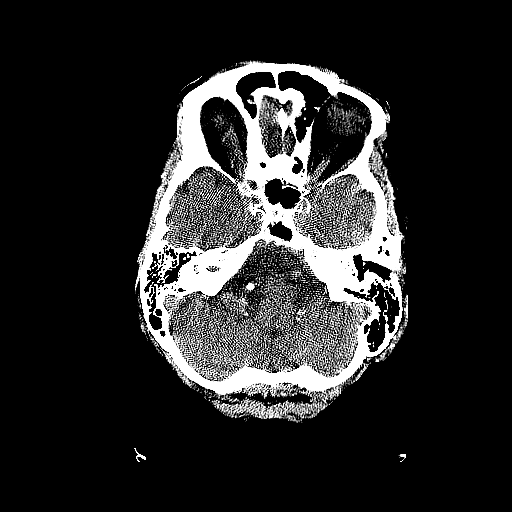
[im 12/56  brain]
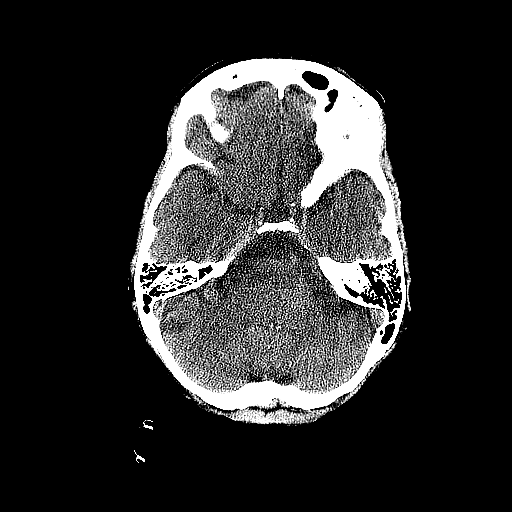
[im 18/56  brain]
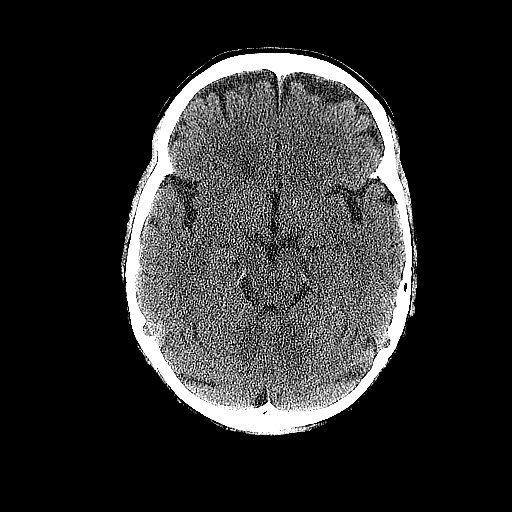
[im 18/56  bone]
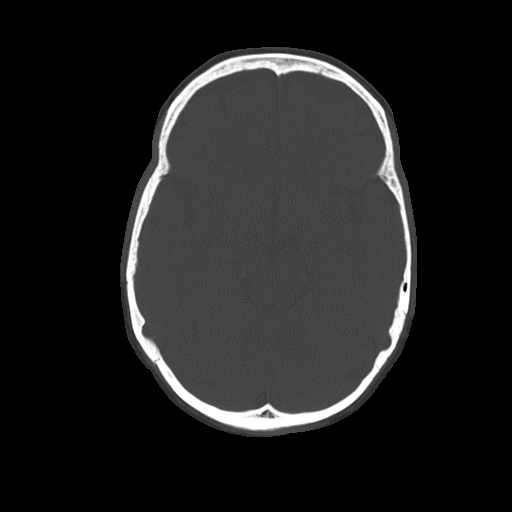
[im 21/56  brain]
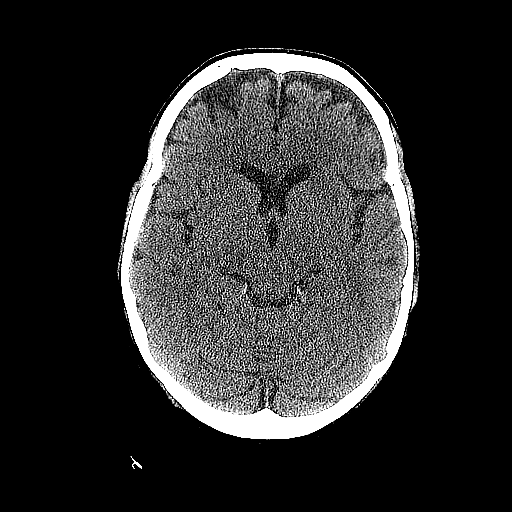
[im 24/56  brain]
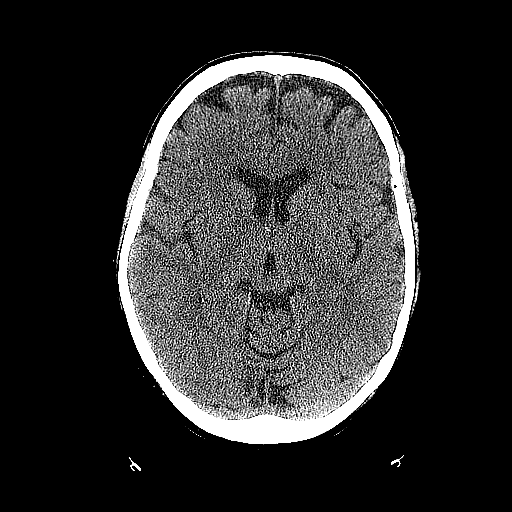
[im 27/56  brain]
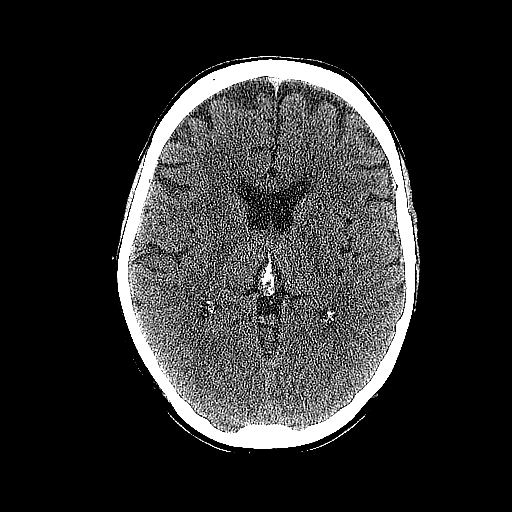
[im 29/56  brain]
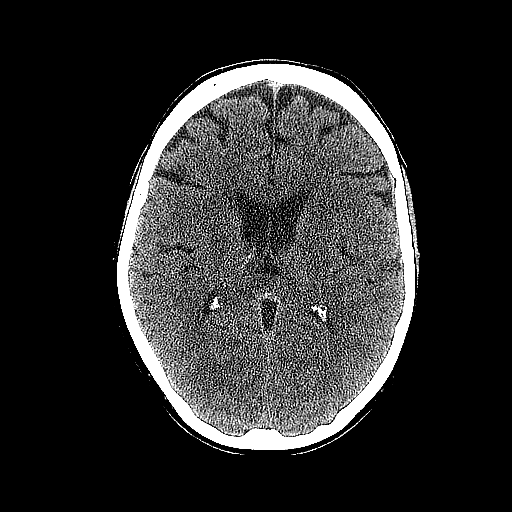
[im 29/56  bone]
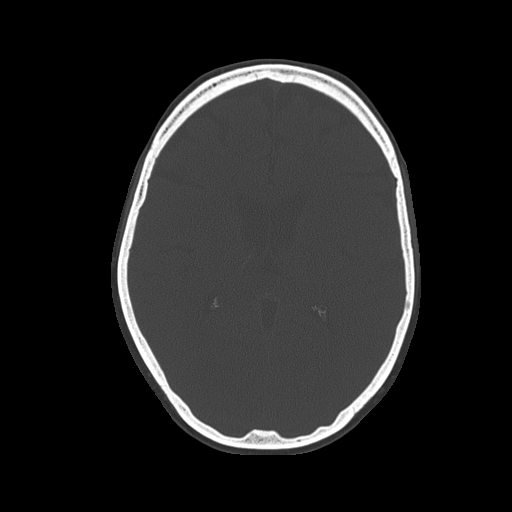
[im 32/56  brain]
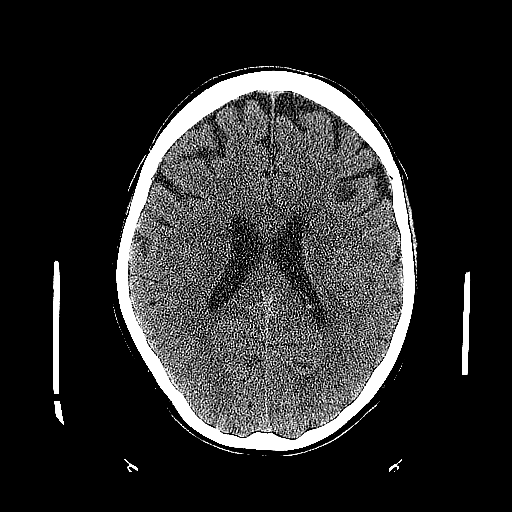
[im 35/56  brain]
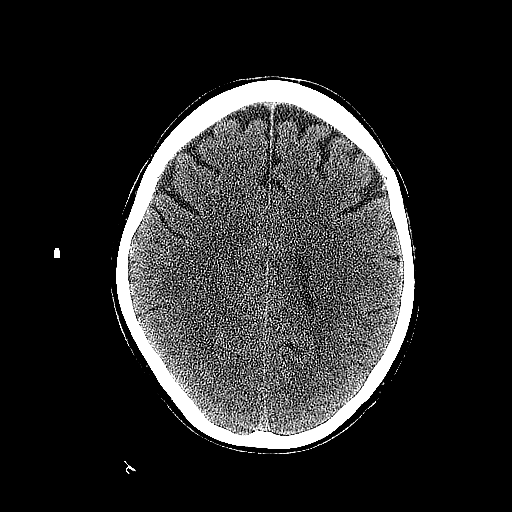
[im 38/56  brain]
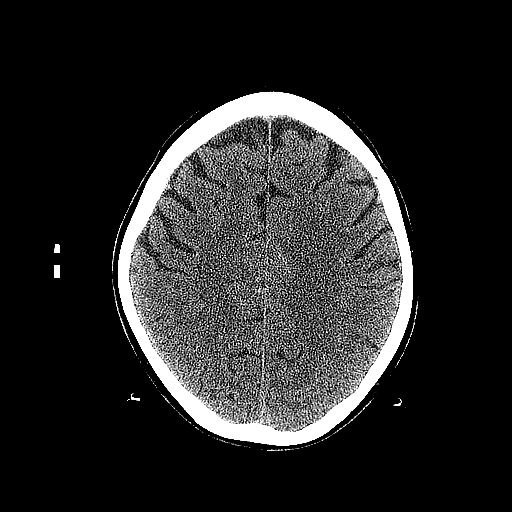
[im 44/56  brain]
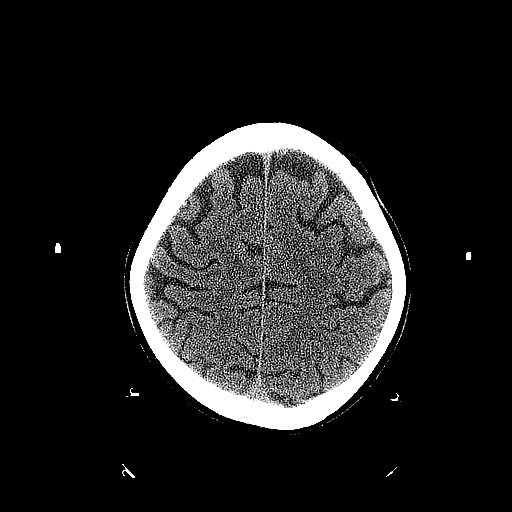
[im 44/56  bone]
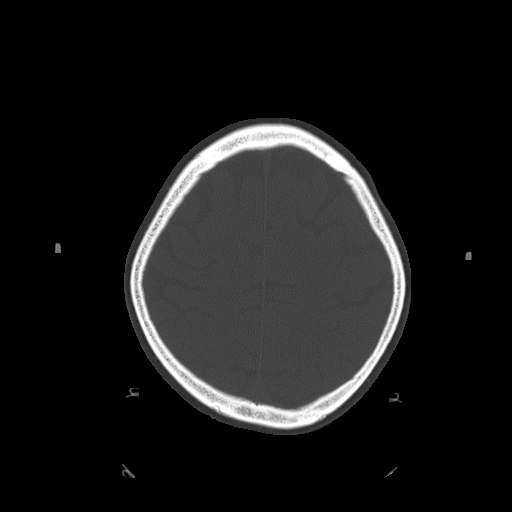
[im 47/56  brain]
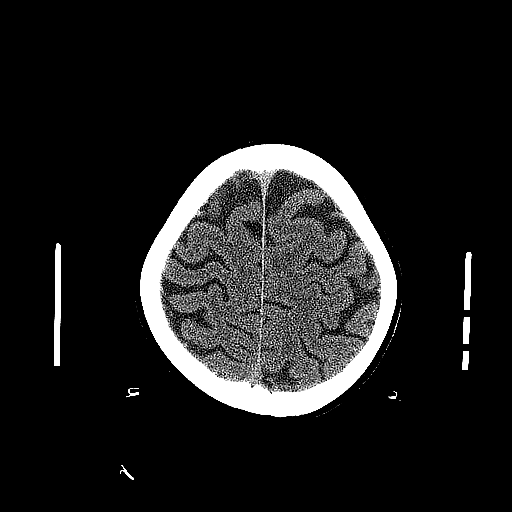
[im 50/56  brain]
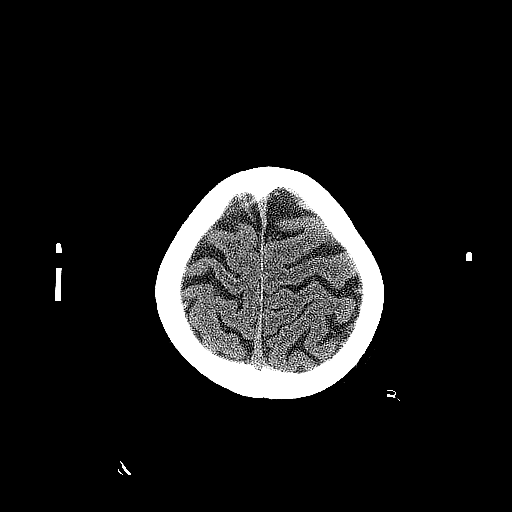
[im 53/56  brain]
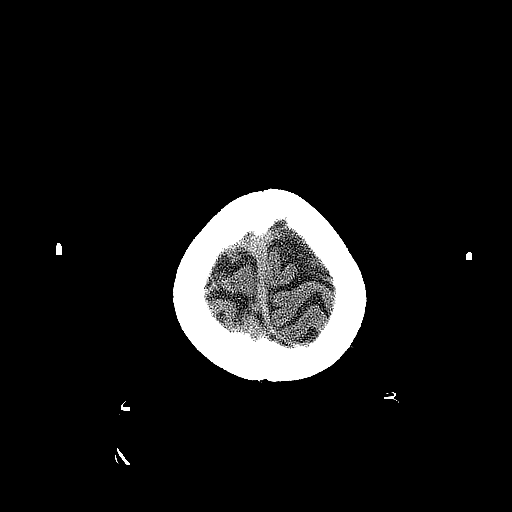

[16 of 30 positions shown; findings below may reference images not displayed]

FINDINGS: No acute intracranial hemorrhage.  No focal mass lesion.
No CT evidence of acute infarction.   No midline shift or mass
effect.  No hydrocephalus.  Basilar cisterns are patent. Paranasal
sinuses and mastoid air cells are clear.  Orbits are normal.
IMPRESSION: No acute intracranial findings.

## 2014-05-31 DIAGNOSIS — M5136 Other intervertebral disc degeneration, lumbar region: Secondary | ICD-10-CM | POA: Diagnosis not present

## 2014-05-31 DIAGNOSIS — M7071 Other bursitis of hip, right hip: Secondary | ICD-10-CM | POA: Diagnosis not present

## 2014-05-31 DIAGNOSIS — M545 Low back pain: Secondary | ICD-10-CM | POA: Diagnosis not present

## 2014-06-07 ENCOUNTER — Telehealth: Payer: Self-pay | Admitting: *Deleted

## 2014-06-07 ENCOUNTER — Other Ambulatory Visit: Payer: Self-pay | Admitting: *Deleted

## 2014-06-07 DIAGNOSIS — F419 Anxiety disorder, unspecified: Secondary | ICD-10-CM

## 2014-06-07 MED ORDER — DIAZEPAM 5 MG PO TABS: ORAL_TABLET | ORAL | Status: DC

## 2014-06-07 NOTE — Telephone Encounter (Signed)
Rx called in 

## 2014-06-07 NOTE — Telephone Encounter (Signed)
Patient requests valium refill be sent to [facility]. Thanks, MI

## 2014-06-07 NOTE — Telephone Encounter (Signed)
Okay to refill valium

## 2014-06-30 DIAGNOSIS — H04123 Dry eye syndrome of bilateral lacrimal glands: Secondary | ICD-10-CM | POA: Diagnosis not present

## 2014-06-30 DIAGNOSIS — H2513 Age-related nuclear cataract, bilateral: Secondary | ICD-10-CM | POA: Diagnosis not present

## 2014-07-12 ENCOUNTER — Other Ambulatory Visit: Payer: Self-pay | Admitting: Cardiology

## 2014-07-13 NOTE — Telephone Encounter (Signed)
Okay to refill amitryptyline

## 2014-08-19 DIAGNOSIS — G43019 Migraine without aura, intractable, without status migrainosus: Secondary | ICD-10-CM | POA: Diagnosis not present

## 2014-08-19 DIAGNOSIS — M791 Myalgia: Secondary | ICD-10-CM | POA: Diagnosis not present

## 2014-08-19 DIAGNOSIS — R51 Headache: Secondary | ICD-10-CM | POA: Diagnosis not present

## 2014-08-19 DIAGNOSIS — G4485 Primary stabbing headache: Secondary | ICD-10-CM | POA: Diagnosis not present

## 2014-08-19 DIAGNOSIS — M542 Cervicalgia: Secondary | ICD-10-CM | POA: Diagnosis not present

## 2014-09-05 ENCOUNTER — Other Ambulatory Visit: Payer: Self-pay | Admitting: Dermatology

## 2014-09-05 DIAGNOSIS — L570 Actinic keratosis: Secondary | ICD-10-CM | POA: Diagnosis not present

## 2014-09-05 DIAGNOSIS — D2222 Melanocytic nevi of left ear and external auricular canal: Secondary | ICD-10-CM | POA: Diagnosis not present

## 2014-09-05 DIAGNOSIS — C44219 Basal cell carcinoma of skin of left ear and external auricular canal: Secondary | ICD-10-CM | POA: Diagnosis not present

## 2014-09-05 DIAGNOSIS — C44319 Basal cell carcinoma of skin of other parts of face: Secondary | ICD-10-CM | POA: Diagnosis not present

## 2014-09-05 DIAGNOSIS — D2239 Melanocytic nevi of other parts of face: Secondary | ICD-10-CM | POA: Diagnosis not present

## 2014-09-05 DIAGNOSIS — L820 Inflamed seborrheic keratosis: Secondary | ICD-10-CM | POA: Diagnosis not present

## 2014-09-06 DIAGNOSIS — G43019 Migraine without aura, intractable, without status migrainosus: Secondary | ICD-10-CM | POA: Diagnosis not present

## 2014-09-06 DIAGNOSIS — G4485 Primary stabbing headache: Secondary | ICD-10-CM | POA: Diagnosis not present

## 2014-09-06 DIAGNOSIS — M542 Cervicalgia: Secondary | ICD-10-CM | POA: Diagnosis not present

## 2014-09-06 DIAGNOSIS — R51 Headache: Secondary | ICD-10-CM | POA: Diagnosis not present

## 2014-09-06 DIAGNOSIS — M791 Myalgia: Secondary | ICD-10-CM | POA: Diagnosis not present

## 2014-09-07 ENCOUNTER — Other Ambulatory Visit (INDEPENDENT_AMBULATORY_CARE_PROVIDER_SITE_OTHER): Payer: Medicare Other | Admitting: *Deleted

## 2014-09-07 DIAGNOSIS — C44219 Basal cell carcinoma of skin of left ear and external auricular canal: Secondary | ICD-10-CM | POA: Diagnosis not present

## 2014-09-07 DIAGNOSIS — C44319 Basal cell carcinoma of skin of other parts of face: Secondary | ICD-10-CM | POA: Diagnosis not present

## 2014-09-07 DIAGNOSIS — E7800 Pure hypercholesterolemia, unspecified: Secondary | ICD-10-CM

## 2014-09-07 LAB — BASIC METABOLIC PANEL
BUN: 14 mg/dL (ref 6–23)
CALCIUM: 9.9 mg/dL (ref 8.4–10.5)
CO2: 26 mEq/L (ref 19–32)
Chloride: 101 mEq/L (ref 96–112)
Creatinine, Ser: 0.86 mg/dL (ref 0.40–1.20)
GFR: 68.4 mL/min (ref >60.00)
Glucose, Bld: 94 mg/dL (ref 70–99)
POTASSIUM: 4 meq/L (ref 3.5–5.1)
Sodium: 138 mEq/L (ref 135–145)

## 2014-09-07 LAB — HEPATIC FUNCTION PANEL
ALT: 29 U/L (ref 0–35)
AST: 30 U/L (ref 0–37)
Albumin: 4.2 g/dL (ref 3.5–5.2)
Alkaline Phosphatase: 43 U/L (ref 39–117)
BILIRUBIN DIRECT: 0 mg/dL (ref 0.0–0.3)
TOTAL PROTEIN: 7.2 g/dL (ref 6.0–8.3)
Total Bilirubin: 0.5 mg/dL (ref 0.2–1.2)

## 2014-09-07 LAB — LIPID PANEL
CHOL/HDL RATIO: 6
Cholesterol: 290 mg/dL — ABNORMAL HIGH (ref 0–200)
HDL: 49.5 mg/dL (ref >39.00)
NONHDL: 240.5
Triglycerides: 265 mg/dL — ABNORMAL HIGH (ref 0.0–149.0)
VLDL: 53 mg/dL — ABNORMAL HIGH (ref 0.0–40.0)

## 2014-09-07 LAB — LDL CHOLESTEROL, DIRECT: Direct LDL: 198 mg/dL

## 2014-09-07 NOTE — Addendum Note (Signed)
Addended by: Eulis Foster on: 09/07/2014 08:42 AM   Modules accepted: Orders

## 2014-09-07 NOTE — Progress Notes (Signed)
Quick Note:  Please make copy of labs for patient visit. ______ 

## 2014-09-09 ENCOUNTER — Ambulatory Visit (INDEPENDENT_AMBULATORY_CARE_PROVIDER_SITE_OTHER): Payer: Medicare Other | Admitting: Cardiology

## 2014-09-09 ENCOUNTER — Encounter: Payer: Self-pay | Admitting: Cardiology

## 2014-09-09 VITALS — BP 126/72 | HR 72 | Ht 63.5 in | Wt 137.8 lb

## 2014-09-09 DIAGNOSIS — M797 Fibromyalgia: Secondary | ICD-10-CM

## 2014-09-09 DIAGNOSIS — I341 Nonrheumatic mitral (valve) prolapse: Secondary | ICD-10-CM | POA: Diagnosis not present

## 2014-09-09 DIAGNOSIS — E7800 Pure hypercholesterolemia, unspecified: Secondary | ICD-10-CM

## 2014-09-09 MED ORDER — BUTALBITAL-ASA-CAFFEINE 50-325-40 MG PO CAPS: 1.0000 | ORAL_CAPSULE | ORAL | Status: DC | PRN

## 2014-09-09 NOTE — Patient Instructions (Signed)
Medication Instructions:  Refill for Fiorinal called to [facility]: none  Testing/Procedures: none  Follow-Up: Your physician wants you to follow-up in: 4 months with fasting labs (lp/bmet/hfp)  You will receive a reminder letter in the mail two months in advance. If you don't receive a letter, please call our office to schedule the follow-up appointment.

## 2014-09-09 NOTE — Addendum Note (Signed)
Addended by: Alvina Filbert B on: 09/09/2014 02:08 PM   Modules accepted: Orders

## 2014-09-09 NOTE — Progress Notes (Signed)
Cardiology Office Note   Date:  09/09/2014   ID:  Daneen, Volcy 23-Aug-1940, MRN [id number]  PCP:  Warren Danes, MD  Cardiologist: Darlin Coco MD  No chief complaint on file.     History of Present Illness: Shelby [name] is a 74 y.o. female who presents for scheduled four-month follow-up visit.  This pleasant 74 year old woman is seen for a scheduled followup office visit. She has a past history of hypercholesterolemia, anemia and suspected mitral valve prolapse. She also has a past history of breast cancer of the right breast with reconstruction. She also in the summer of 2012 on a routine colonoscopy was found to have cancer of the right colon which was asymptomatic and this was successfully removed. All lymph nodes were negative and she did not require any subsequent chemotherapy. She is doing well postop except that she tends to be more prone to diarrhea and loose stools now that this section of her colon has been removed. The patient recently also was complaining of a lot of pain deep in her bones. We obtained a PET scan which did not show any definite cancer but raise the question of an abnormality in her spine. However subsequent MRI did not show any cancer of her spine. Since last visit the patient has been experiencing more diffuse myalgias which she attributes to her fibromyalgia. The episodes usually last for several days and she finds that taking a Valium helps with the relief of pain. The patient has chronic headaches. She is on gabapentin from the [facility] and is followed by Dr. Domingo Cocking. Since last visit she has been doing better with her exercise. She walks 1.1 miles every day. She had a recent colonoscopy. She will not need another one for 3 years. She is prior total hysterectomy. In October 2015 she fell down some steps. She injured her right hip. She did not go to see a physician initially. She eventually went to see Dr. Maxie Better, orthopedist, who  diagnosed a cold tendon in the right hip.She is only now getting back to her usual schedule of walking.  It has taken her a long time to recover from the fall.. She has a history of idiopathic stabbing migraine headaches.  She is followed at the headache Center.  They have been giving her a series of injections into her cervical spine to help with the headaches.. The patient has not been experiencing any chest pain or palpitations dizziness or syncope. Her appetite is good and her weight is up 2 pounds.  She expects that her weight will come down now that she is walking again.  Past Medical History  Diagnosis Date  . MVP (mitral valve prolapse)   . Eosinophilia   . Syncope     secondary to vasovagal phenomenon  . Hypercholesterolemia     statin intolerant  . Ovarian cyst   . Heart murmur     mitral valve prolapse  . Depression   . Breast cancer 2001    s/p surgery and chemo   . Colon cancer 2012  . Allergy   . Arthritis   . Asthma   . Migraine     Past Surgical History  Procedure Laterality Date  . Mastectomy  2001  . Colonscopy    . Back surgery  [id number]  . Shoulder surgery  2000  . Spine surgery    . Cosmetic surgery      face lift  . Breast surgery  2000-mastectomy and TRAM with mesh  . Vaginal hysterectomy  1979  . Appendectomy  1970  . Laparoscopic salpingoopherectomy  8.29.12    LAP.W/BSO    . Colon surgery    . Rotator cuff repair    . Artery biopsy Left 06/10/2012    Procedure: LEFT BIOPSY TEMPORAL ARTERY;  Surgeon: Harl Bowie, MD;  Location: [facility];  Service: General;  Laterality: Left;     Current Outpatient Prescriptions  Medication Sig Dispense Refill  . amitriptyline (ELAVIL) 50 MG tablet Take 50-100 mg by mouth at bedtime as needed for sleep (sleep or headache).    . Ascorbic Acid (VITAMIN C) 1000 MG tablet Take 1,000 mg by mouth daily.    Marland Kitchen atenolol (TENORMIN) 25 MG tablet Take 25-50 mg by mouth daily.    . butalbital-aspirin-caffeine  (FIORINAL) 50-325-40 MG per capsule Take 1 capsule by mouth every 4 (four) hours as needed for headache (headache).    . Calcium Carbonate-Vitamin D (CALCIUM 600 + D PO) Take 1 tablet by mouth 2 (two) times daily.     . diazepam (VALIUM) 5 MG tablet Take 5 mg by mouth daily as needed for anxiety (fibromyalgia pain).    Marland Kitchen gabapentin (NEURONTIN) 100 MG capsule Take 100 mg by mouth 3 (three) times daily as needed (headache).     . gabapentin (NEURONTIN) 600 MG tablet Take 600 mg by mouth 2 (two) times daily.    . Omega-3 Fatty Acids (FISH OIL) 1200 MG CAPS Take 1,200 mg by mouth daily.    . Tretinoin, Facial Wrinkles, (TRETINOIN, EMOLLIENT,) 0.05 % CREA APPLY AT BEDTIME 40 g 3  . vitamin B-12 (CYANOCOBALAMIN) 1000 MCG tablet Take 1,000 mcg by mouth daily.     No current facility-administered medications for this visit.    Allergies:   Latex; Benzonatate; Codeine; Morphine and related; Skelaxin; Penicillins; and Sulfonamide derivatives    Social History:  The patient  reports that she has never smoked. She has never used smokeless tobacco. She reports that she drinks about 0.6 oz of alcohol per week. She reports that she does not use illicit drugs.   Family History:  The patient's family history includes Arthritis in her mother; Breast cancer in her paternal grandfather; Cancer in her son; Diabetes in her paternal aunt; Heart attack in her father; Heart disease in her father; Hypertension in her father.    ROS:  Please see the history of present illness.   Otherwise, review of systems are positive for none.   All other systems are reviewed and negative.    PHYSICAL EXAM: VS:  BP 126/72 mmHg  Pulse 72  Ht 5' 3.5" (1.613 m)  Wt 137 lb 12.8 oz (62.506 kg)  BMI 24.02 kg/m2 , BMI Body mass index is 24.02 kg/(m^2). GEN: Well nourished, well developed, in no acute distress HEENT: normal Neck: no JVD, carotid bruits, or masses Cardiac: RRR; no  rubs, or gallops,no edema.  Soft apical systolic  murmur. Respiratory:  clear to auscultation bilaterally, normal work of breathing GI: soft, nontender, nondistended, + BS MS: no deformity or atrophy Skin: warm and dry, no rash Neuro:  Strength and sensation are intact Psych: euthymic mood, full affect   EKG:  EKG is not ordered today.   Recent Labs: 09/07/2014: ALT 29; BUN 14; Creatinine, Ser 0.86; Potassium 4.0; Sodium 138    Lipid Panel    Component Value Date/Time   CHOL 290* 09/07/2014 0843   TRIG 265.0* 09/07/2014 0843   HDL 49.50  09/07/2014 0843   CHOLHDL 6 09/07/2014 0843   VLDL 53.0* 09/07/2014 0843   LDLCALC 169* 08/30/2013 0914   LDLDIRECT 198.0 09/07/2014 0843      Wt Readings from Last 3 Encounters:  09/09/14 137 lb 12.8 oz (62.506 kg)  05/11/14 135 lb 12.8 oz (61.598 kg)  01/03/14 136 lb (61.689 kg)         ASSESSMENT AND PLAN:  1. Mitral valve prolapse suspected 2. Hypercholesterolemia 3. Fibromyalgia 4. history of breast cancer of right breast with reconstruction 5. history of colon cancer 6. Idiopathic stabbing migraine headache syndrome followed at the headache Center by Dr. Domingo Cocking 7. Recent fall in October 2015 with resultant right hip injury, followed by Dr. Maxie Better.  She is finally improved.    Current medicines are reviewed at length with the patient today.  The patient does not have concerns regarding medicines.  The following changes have been made:  no change  Labs/ tests ordered today include:  No orders of the defined types were placed in this encounter.    Disposition: Continue current medication.  We refilled her Fiorinal.  She will be rechecked in 4 months for office visit lipid panel hepatic function panel and basal metabolic panel.  We reviewed today's labs which are slightly improved from last time.  Berna Spare MD 09/09/2014 1:33 PM    [facility] [place], [place], [place]  [address] Phone: [phone]; Fax: [phone]

## 2014-09-12 ENCOUNTER — Other Ambulatory Visit: Payer: Self-pay

## 2014-09-27 ENCOUNTER — Other Ambulatory Visit: Payer: Self-pay

## 2014-09-27 DIAGNOSIS — M542 Cervicalgia: Secondary | ICD-10-CM | POA: Diagnosis not present

## 2014-09-27 DIAGNOSIS — G43019 Migraine without aura, intractable, without status migrainosus: Secondary | ICD-10-CM | POA: Diagnosis not present

## 2014-09-27 DIAGNOSIS — G4485 Primary stabbing headache: Secondary | ICD-10-CM | POA: Diagnosis not present

## 2014-09-27 DIAGNOSIS — Z1231 Encounter for screening mammogram for malignant neoplasm of breast: Secondary | ICD-10-CM

## 2014-09-27 DIAGNOSIS — R51 Headache: Secondary | ICD-10-CM | POA: Diagnosis not present

## 2014-09-27 DIAGNOSIS — M791 Myalgia: Secondary | ICD-10-CM | POA: Diagnosis not present

## 2014-10-19 DIAGNOSIS — Z85828 Personal history of other malignant neoplasm of skin: Secondary | ICD-10-CM | POA: Diagnosis not present

## 2014-10-19 DIAGNOSIS — M542 Cervicalgia: Secondary | ICD-10-CM | POA: Diagnosis not present

## 2014-10-19 DIAGNOSIS — R51 Headache: Secondary | ICD-10-CM | POA: Diagnosis not present

## 2014-10-19 DIAGNOSIS — L905 Scar conditions and fibrosis of skin: Secondary | ICD-10-CM | POA: Diagnosis not present

## 2014-10-19 DIAGNOSIS — G43019 Migraine without aura, intractable, without status migrainosus: Secondary | ICD-10-CM | POA: Diagnosis not present

## 2014-10-19 DIAGNOSIS — Z08 Encounter for follow-up examination after completed treatment for malignant neoplasm: Secondary | ICD-10-CM | POA: Diagnosis not present

## 2014-10-19 DIAGNOSIS — M791 Myalgia: Secondary | ICD-10-CM | POA: Diagnosis not present

## 2014-10-19 DIAGNOSIS — G4485 Primary stabbing headache: Secondary | ICD-10-CM | POA: Diagnosis not present

## 2014-10-31 ENCOUNTER — Ambulatory Visit
Admission: RE | Admit: 2014-10-31 | Discharge: 2014-10-31 | Disposition: A | Discharge status: Home / Self Care | Payer: Medicare Other | Source: Ambulatory Visit

## 2014-10-31 DIAGNOSIS — Z1231 Encounter for screening mammogram for malignant neoplasm of breast: Secondary | ICD-10-CM | POA: Diagnosis not present

## 2014-12-28 ENCOUNTER — Encounter: Payer: Self-pay | Admitting: Gynecology

## 2014-12-28 ENCOUNTER — Ambulatory Visit (INDEPENDENT_AMBULATORY_CARE_PROVIDER_SITE_OTHER): Payer: Medicare Other | Admitting: Gynecology

## 2014-12-28 VITALS — BP 122/76 | Ht 63.5 in | Wt 133.0 lb

## 2014-12-28 DIAGNOSIS — R1030 Lower abdominal pain, unspecified: Secondary | ICD-10-CM | POA: Diagnosis not present

## 2014-12-28 LAB — URINALYSIS W MICROSCOPIC + REFLEX CULTURE
Bilirubin Urine: NEGATIVE
CASTS: NONE SEEN [LPF]
Crystals: NONE SEEN [HPF]
Glucose, UA: NEGATIVE
Hgb urine dipstick: NEGATIVE
KETONES UR: NEGATIVE
NITRITE: NEGATIVE
PH: 7 (ref 5.0–8.0)
Protein, ur: NEGATIVE
SPECIFIC GRAVITY, URINE: 1.01 (ref 1.001–1.035)
YEAST: NONE SEEN [HPF]

## 2014-12-28 NOTE — Progress Notes (Signed)
Shelby [name] 24-Jul-1940 [id number]        74 y.o.  G3P0011 Presents complaining of bilateral groin pain over the last several months. No precipitating events such as new exercise programs or injury. History of TVH and subsequent laparoscopic BSO. History of breast cancer and colon cancer. No vaginal issues as far as itching, dryness or any vaginal bleeding. No frequency dysuria or urgency. 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  Past medical history,surgical history, problem list, medications, allergies, family history and social history were all reviewed and documented in the EPIC chart.  Directed ROS with pertinent positives and negatives documented in the history of present illness/assessment and plan.  Exam: Kim assistant Filed Vitals:   12/28/14 0909  BP: 122/76  Height: 5' 3.5" (1.613 m)  Weight: 133 lb (60.328 kg)   General appearance:  Normal Abdomen soft nontender without masses guarding rebound. Both inguinal/groin areas examined without evidence of swelling, adenopathy or significant tenderness on exam. No evidence of hernia. Pelvic external BUS vagina with atrophic changes. Bimanual exam without palpable masses or tenderness. Rectal exam is normal.  Assessment/Plan:  74 y.o. G3P0011 with bilateral groin pain. Do not feel it is of GYN etiology. Exam is normal. Recommend starting with stretching exercises to see if this does not help. Follow up with orthopedics if continues for further evaluation. Patient has not been seen in the office for several years but does have a full exam scheduled in December and she'll follow up for this.    Anastasio Auerbach MD, 9:41 AM 12/28/2014

## 2014-12-28 NOTE — Addendum Note (Signed)
Addended by: Nelva Nay on: 12/28/2014 11:08 AM   Modules accepted: Orders

## 2014-12-28 NOTE — Patient Instructions (Signed)
Follow up with orthopedics if her groin pain continues. Follow up for your scheduled annual exam in December

## 2014-12-29 LAB — URINE CULTURE
COLONY COUNT: NO GROWTH
ORGANISM ID, BACTERIA: NO GROWTH

## 2015-01-20 ENCOUNTER — Other Ambulatory Visit: Payer: Self-pay | Admitting: Physician Assistant

## 2015-01-20 ENCOUNTER — Other Ambulatory Visit (INDEPENDENT_AMBULATORY_CARE_PROVIDER_SITE_OTHER): Payer: Medicare Other | Admitting: *Deleted

## 2015-01-20 DIAGNOSIS — R1031 Right lower quadrant pain: Secondary | ICD-10-CM | POA: Diagnosis not present

## 2015-01-20 DIAGNOSIS — E7800 Pure hypercholesterolemia, unspecified: Secondary | ICD-10-CM | POA: Diagnosis not present

## 2015-01-20 DIAGNOSIS — R1030 Lower abdominal pain, unspecified: Secondary | ICD-10-CM | POA: Diagnosis not present

## 2015-01-20 DIAGNOSIS — M25551 Pain in right hip: Secondary | ICD-10-CM | POA: Diagnosis not present

## 2015-01-20 DIAGNOSIS — M545 Low back pain: Secondary | ICD-10-CM | POA: Diagnosis not present

## 2015-01-20 DIAGNOSIS — K6289 Other specified diseases of anus and rectum: Secondary | ICD-10-CM | POA: Diagnosis not present

## 2015-01-20 LAB — HEPATIC FUNCTION PANEL
ALK PHOS: 47 U/L (ref 33–130)
ALT: 18 U/L (ref 6–29)
AST: 24 U/L (ref 10–35)
Albumin: 4.2 g/dL (ref 3.6–5.1)
BILIRUBIN DIRECT: 0.1 mg/dL (ref <=0.2)
BILIRUBIN INDIRECT: 0.4 mg/dL (ref 0.2–1.2)
TOTAL PROTEIN: 7.2 g/dL (ref 6.1–8.1)
Total Bilirubin: 0.5 mg/dL (ref 0.2–1.2)

## 2015-01-20 LAB — BASIC METABOLIC PANEL
BUN: 14 mg/dL (ref 7–25)
CHLORIDE: 101 mmol/L (ref 98–110)
CO2: 25 mmol/L (ref 20–31)
CREATININE: 0.93 mg/dL (ref 0.60–0.93)
Calcium: 9.6 mg/dL (ref 8.6–10.4)
GLUCOSE: 89 mg/dL (ref 65–99)
POTASSIUM: 4.8 mmol/L (ref 3.5–5.3)
Sodium: 136 mmol/L (ref 135–146)

## 2015-01-20 LAB — LIPID PANEL
Cholesterol: 283 mg/dL — ABNORMAL HIGH (ref 125–200)
HDL: 42 mg/dL — ABNORMAL LOW (ref >=46)
LDL CALC: 179 mg/dL — AB (ref <130)
TRIGLYCERIDES: 310 mg/dL — AB (ref <150)
Total CHOL/HDL Ratio: 6.7 Ratio — ABNORMAL HIGH (ref <=5.0)
VLDL: 62 mg/dL — AB (ref <30)

## 2015-01-20 NOTE — Progress Notes (Signed)
Quick Note:  Please make copy of labs for patient visit. ______ 

## 2015-01-23 ENCOUNTER — Ambulatory Visit (INDEPENDENT_AMBULATORY_CARE_PROVIDER_SITE_OTHER): Payer: Medicare Other | Admitting: Cardiology

## 2015-01-23 ENCOUNTER — Encounter: Payer: Self-pay | Admitting: Cardiology

## 2015-01-23 VITALS — BP 140/72 | HR 80 | Ht 63.5 in | Wt 135.8 lb

## 2015-01-23 DIAGNOSIS — I341 Nonrheumatic mitral (valve) prolapse: Secondary | ICD-10-CM | POA: Diagnosis not present

## 2015-01-23 DIAGNOSIS — E7800 Pure hypercholesterolemia, unspecified: Secondary | ICD-10-CM | POA: Diagnosis not present

## 2015-01-23 DIAGNOSIS — M797 Fibromyalgia: Secondary | ICD-10-CM | POA: Diagnosis not present

## 2015-01-23 DIAGNOSIS — Z85828 Personal history of other malignant neoplasm of skin: Secondary | ICD-10-CM | POA: Diagnosis not present

## 2015-01-23 NOTE — Patient Instructions (Signed)
Medication Instructions:  Your physician recommends that you continue on your current medications as directed. Please refer to the Current Medication list given to you today.  Labwork: none  Testing/Procedures: none  Follow-Up: Your physician recommends that you schedule a follow-up appointment in: 4 months with fasting labs (lp/bmet/hfp) with Tera Helper NP or Brynda Rim PA   If you need a refill on your cardiac medications before your next appointment, please call your pharmacy.

## 2015-01-23 NOTE — Progress Notes (Signed)
Cardiology Office Note   Date:  01/24/2015   ID:  Shelby, [name] 12/02/1940, MRN [id number]  PCP:  Warren Danes, MD  Cardiologist: Darlin Coco MD  Chief Complaint  Patient presents with  . Mitral Valve Prolapse      History of Present Illness: Shelby [name] is a 74 y.o. female who presents for scheduled four-month follow-up visit. This pleasant 74 year old woman is seen for a scheduled followup office visit. She has a past history of hypercholesterolemia, anemia and suspected mitral valve prolapse. She also has a past history of breast cancer of the right breast with reconstruction. She also in the summer of 2012 on a routine colonoscopy was found to have cancer of the right colon which was asymptomatic and this was successfully removed. All lymph nodes were negative and she did not require any subsequent chemotherapy. She is doing well postop except that she tends to be more prone to diarrhea and loose stools now that this section of her colon has been removed. The patient recently also was complaining of a lot of pain deep in her bones. We obtained a PET scan which did not show any definite cancer but raise the question of an abnormality in her spine. However subsequent MRI did not show any cancer of her spine. Since last visit the patient has been experiencing more diffuse myalgias which she attributes to her fibromyalgia. The episodes usually last for several days and she finds that taking a Valium helps with the relief of pain. The patient has chronic headaches. She is on gabapentin from the headache Center and is followed by Dr. Domingo Cocking. Since last visit she has been doing better with her exercise. She walks 1.1 miles every day. She had a recent colonoscopy. She will not need another one for 3 years. She is prior total hysterectomy. In October 2015 she fell down some steps. She injured her right hip. She did not go to see a physician initially. She eventually  went to see Dr. Maxie Better, orthopedist, who diagnosed a pulled tendon in the right hip.She continues to have a lot of pelvic pain.  She recently saw her gynecologist who found no problem in the GYN area.  She recently saw Dr. Cristina Gong and he has ordered some CT scans for later this week The patient has not been experiencing any chest pain or palpitations dizziness or syncope. The patient has not been having any chest pain or shortness of breath.  No recent palpitations.  No peripheral edema. We reviewed her lab work which is satisfactory except for chronically elevated lipids  Past Medical History  Diagnosis Date  . MVP (mitral valve prolapse)   . Eosinophilia   . Syncope     secondary to vasovagal phenomenon  . Hypercholesterolemia     statin intolerant  . Ovarian cyst   . Heart murmur     mitral valve prolapse  . Depression   . Breast cancer ([place]) 2001    s/p surgery and chemo   . Colon cancer ([place]) 2012  . Allergy   . Arthritis   . Asthma   . Migraine     Past Surgical History  Procedure Laterality Date  . Mastectomy  2001  . Colonscopy    . Back surgery  1884,1660  . Shoulder surgery  2000  . Spine surgery    . Cosmetic surgery      face lift  . Breast surgery      2000-mastectomy and TRAM with  mesh  . Vaginal hysterectomy  1979  . Appendectomy  1970  . Laparoscopic salpingoopherectomy  8.29.12    LAP.W/BSO    . Colon surgery    . Rotator cuff repair    . Artery biopsy Left 06/10/2012    Procedure: LEFT BIOPSY TEMPORAL ARTERY;  Surgeon: Harl Bowie, MD;  Location: [facility];  Service: General;  Laterality: Left;  . Colon resection       Current Outpatient Prescriptions  Medication Sig Dispense Refill  . amitriptyline (ELAVIL) 50 MG tablet Take 50-100 mg by mouth at bedtime as needed for sleep (sleep or headache).    . Ascorbic Acid (VITAMIN C) 1000 MG tablet Take 1,000 mg by mouth daily.    Marland Kitchen atenolol (TENORMIN) 25 MG tablet Take 25-50 mg by mouth daily.    .  butalbital-aspirin-caffeine (FIORINAL) 50-325-40 MG per capsule Take 1 capsule by mouth every 4 (four) hours as needed for headache (headache). 30 capsule 3  . Calcium Carbonate-Vitamin D (CALCIUM 600 + D PO) Take 1 tablet by mouth 2 (two) times daily.     . diazepam (VALIUM) 5 MG tablet Take 5 mg by mouth daily as needed for anxiety (fibromyalgia pain).    . Omega-3 Fatty Acids (FISH OIL) 1200 MG CAPS Take 1,200 mg by mouth daily.    . vitamin B-12 (CYANOCOBALAMIN) 1000 MCG tablet Take 1,000 mcg by mouth daily.     No current facility-administered medications for this visit.    Allergies:   Latex; Benzonatate; Codeine; Morphine and related; Skelaxin; Penicillins; and Sulfonamide derivatives    Social History:  The patient  reports that she has never smoked. She has never used smokeless tobacco. She reports that she drinks about 0.6 oz of alcohol per week. She reports that she does not use illicit drugs.   Family History:  The patient's family history includes Arthritis in her mother; Cancer in her son; Diabetes in her paternal aunt; Heart attack in her father; Heart disease in her father; Hypertension in her father; Stroke in her paternal grandfather.    ROS:  Please see the history of present illness.   Otherwise, review of systems are positive for none.   All other systems are reviewed and negative.    PHYSICAL EXAM: VS:  BP 140/72 mmHg  Pulse 80  Ht 5' 3.5" (1.613 m)  Wt 135 lb 12.8 oz (61.598 kg)  BMI 23.68 kg/m2 , BMI Body mass index is 23.68 kg/(m^2). GEN: Well nourished, well developed, in no acute distress HEENT: normal Neck: no JVD, carotid bruits, or masses Cardiac: RRR; no murmurs, rubs, or gallops,no edema.  Question faint apical click  Respiratory:  clear to auscultation bilaterally, normal work of breathing GI: soft, nontender, nondistended, + BS MS: no deformity or atrophy Skin: warm and dry, no rash Neuro:  Strength and sensation are intact Psych: euthymic mood,  full affect   EKG:  EKG is not ordered today.    Recent Labs: 01/20/2015: ALT 18; BUN 14; Creat 0.93; Potassium 4.8; Sodium 136    Lipid Panel    Component Value Date/Time   CHOL 283* 01/20/2015 0836   TRIG 310* 01/20/2015 0836   HDL 42* 01/20/2015 0836   CHOLHDL 6.7* 01/20/2015 0836   VLDL 62* 01/20/2015 0836   LDLCALC 179* 01/20/2015 0836   LDLDIRECT 198.0 09/07/2014 0843      Wt Readings from Last 3 Encounters:  01/23/15 135 lb 12.8 oz (61.598 kg)  12/28/14 133 lb (60.328 kg)  09/09/14  137 lb 12.8 oz (62.506 kg)         ASSESSMENT AND PLAN:  1.  Past history of suspected Mitral valve prolapse. 2. Hypercholesterolemia 3. Fibromyalgia 4. history of breast cancer of right breast with reconstruction 5. history of colon cancer 6. Idiopathic stabbing migraine headache syndrome followed at the headache Center by Dr. Domingo Cocking 7. Recent fall in October 2015 with resultant right hip injury, followed by Dr. Maxie Better.Continues to have vague pelvic pain.  Workup in progress.   Current medicines are reviewed at length with the patient today.  The patient does not have concerns regarding medicines.  The following changes have been made:  no change  Labs/ tests ordered today include:  No orders of the defined types were placed in this encounter.    Disposition: Continue current medication.  Recheck in 4 months for office visit lipid panel hepatic function panel and basal metabolic panel.  Continue to try to exercise and watch dietary cholesterol closely.  Berna Spare MD 01/24/2015 11:32 AM    [address], [place], [place]  [address] Phone: [phone]; Fax: [phone]

## 2015-01-26 ENCOUNTER — Ambulatory Visit
Admission: RE | Admit: 2015-01-26 | Discharge: 2015-01-26 | Disposition: A | Discharge status: Home / Self Care | Payer: Medicare Other | Source: Ambulatory Visit | Attending: Physician Assistant | Admitting: Physician Assistant

## 2015-01-26 DIAGNOSIS — R102 Pelvic and perineal pain: Secondary | ICD-10-CM | POA: Diagnosis not present

## 2015-01-26 DIAGNOSIS — R1031 Right lower quadrant pain: Secondary | ICD-10-CM

## 2015-01-26 DIAGNOSIS — R1030 Lower abdominal pain, unspecified: Secondary | ICD-10-CM | POA: Diagnosis not present

## 2015-01-26 MED ORDER — IOPAMIDOL (ISOVUE-300) INJECTION 61%
100.0000 mL | Freq: Once | INTRAVENOUS | Status: AC | PRN
  Administered 2015-01-26: 100 mL via INTRAVENOUS

## 2015-02-16 ENCOUNTER — Other Ambulatory Visit: Payer: Self-pay | Admitting: Cardiology

## 2015-02-16 DIAGNOSIS — F419 Anxiety disorder, unspecified: Secondary | ICD-10-CM

## 2015-02-16 DIAGNOSIS — M8580 Other specified disorders of bone density and structure, unspecified site: Secondary | ICD-10-CM

## 2015-02-16 HISTORY — DX: Other specified disorders of bone density and structure, unspecified site: M85.80

## 2015-02-17 DIAGNOSIS — K6289 Other specified diseases of anus and rectum: Secondary | ICD-10-CM | POA: Diagnosis not present

## 2015-02-17 DIAGNOSIS — R1030 Lower abdominal pain, unspecified: Secondary | ICD-10-CM | POA: Diagnosis not present

## 2015-02-17 DIAGNOSIS — R109 Unspecified abdominal pain: Secondary | ICD-10-CM | POA: Diagnosis not present

## 2015-02-17 DIAGNOSIS — Z85038 Personal history of other malignant neoplasm of large intestine: Secondary | ICD-10-CM | POA: Diagnosis not present

## 2015-02-20 ENCOUNTER — Other Ambulatory Visit: Payer: Self-pay | Admitting: Cardiology

## 2015-02-20 DIAGNOSIS — F419 Anxiety disorder, unspecified: Secondary | ICD-10-CM

## 2015-02-20 NOTE — Telephone Encounter (Signed)
New message       *STAT* If patient is at the pharmacy, call can be transferred to refill team.   1. Which medications need to be refilled? (please list name of each medication and dose if known) valium  2. Which pharmacy/location (including street and city if local pharmacy) is medication to be sent to? [facility]  3. Do they need a 30 day or 90 day supply? 30 Pt states her presc has not been called in.  She is out and need it today.  Please call

## 2015-02-21 NOTE — Telephone Encounter (Signed)
No Rx printed, called in Valim 5 mg daily #30 with 2 refills as requested Did remind patient follow up refills need to come from her PCP

## 2015-03-03 ENCOUNTER — Ambulatory Visit (INDEPENDENT_AMBULATORY_CARE_PROVIDER_SITE_OTHER): Payer: Medicare Other | Admitting: Gynecology

## 2015-03-03 ENCOUNTER — Encounter: Payer: Self-pay | Admitting: Gynecology

## 2015-03-03 VITALS — BP 124/76 | Ht 63.5 in | Wt 132.0 lb

## 2015-03-03 DIAGNOSIS — N952 Postmenopausal atrophic vaginitis: Secondary | ICD-10-CM | POA: Diagnosis not present

## 2015-03-03 DIAGNOSIS — Z01419 Encounter for gynecological examination (general) (routine) without abnormal findings: Secondary | ICD-10-CM | POA: Diagnosis not present

## 2015-03-03 DIAGNOSIS — R8279 Other abnormal findings on microbiological examination of urine: Secondary | ICD-10-CM | POA: Diagnosis not present

## 2015-03-03 NOTE — Patient Instructions (Signed)

## 2015-03-03 NOTE — Progress Notes (Signed)
Shelby [name] April 03, 1940 [id number]        74 y.o.  G3P0011 for Breast and pelvic exam  Past medical history,surgical history, problem list, medications, allergies, family history and social history were all reviewed and documented as reviewed in the EPIC chart.  ROS:  Performed with pertinent positives and negatives included in the history, assessment and plan.   Additional significant findings :  none   Exam: Kim Counsellor Vitals:   03/03/15 1356  BP: 124/76  Height: 5' 3.5" (1.613 m)  Weight: 132 lb (59.875 kg)   General appearance:  Normal affect, orientation and appearance. Skin: Grossly normal HEENT: Without gross lesions.  No cervical or supraclavicular adenopathy. Thyroid normal.  Lungs:  Clear without wheezing, rales or rhonchi Cardiac: RR, without RMG Abdominal:  Soft, nontender, without masses, guarding, rebound, organomegaly or hernia Breasts:  Examined lying and sitting. Left without masses, retractions, discharge or axillary adenopathy.  Right status post reconstruction without masses or axillary adenopathy Pelvic:  Ext/BUS/vagina with atrophic changes  Adnexa  Without masses or tenderness    Anus and perineum  Normal   Rectovaginal  Normal sphincter tone without palpated masses or tenderness.    Assessment/Plan:  74 y.o. G61P0011 female for breast and pelvic exam.   1. Postmenopausal/atrophic genital changes. Status post TVH with subsequent upper scopic BSO for benign serous cystadenoma doing well without significant hot flushes, night sweats, vaginal dryness. Continue monitoring report any issues. 2. History of right breast cancer status post mastectomy with reconstruction. Exam NED. Mammography 10/2014. Continue with annual mammography when due. SBE monthly reviewed. 3. Pap smear 2011. No Pap smear done today.  No history of significant abnormal Pap smears.  Per current screening guidelines will stop screening based on age and hysterectomy history  impatience comfortable with this. 4. Colonoscopy 2015. Repeat at their recommended interval. She does continue to have groin pain that she I saw her for in October. She saw her gastroenterologist who did a CT scan which was normal. The pain comes and goes and is not consistent. The patient is going to monitor at present.  She also has a history of colon cancer and again will follow up with the gastroenterologist in reference to this. 5. DEXA 2006. Recommend baseline DEXA now the patient is going to schedule. 6. Health maintenance. No routine lab work done as patient has this done at her primary physician's office. Follow up 1 year, sooner as needed.   Anastasio Auerbach MD, 2:16 PM 03/03/2015

## 2015-03-04 LAB — URINALYSIS W MICROSCOPIC + REFLEX CULTURE
BACTERIA UA: NONE SEEN [HPF]
Bilirubin Urine: NEGATIVE
Casts: NONE SEEN [LPF]
Crystals: NONE SEEN [HPF]
Glucose, UA: NEGATIVE
Hgb urine dipstick: NEGATIVE
KETONES UR: NEGATIVE
NITRITE: NEGATIVE
PH: 6.5 (ref 5.0–8.0)
Protein, ur: NEGATIVE
RBC / HPF: NONE SEEN RBC/HPF (ref <=2)
SPECIFIC GRAVITY, URINE: 1.007 (ref 1.001–1.035)
WBC UA: NONE SEEN WBC/HPF (ref <=5)
YEAST: NONE SEEN [HPF]

## 2015-03-05 LAB — URINE CULTURE: Colony Count: 30000

## 2015-03-06 ENCOUNTER — Other Ambulatory Visit: Payer: Self-pay | Admitting: *Deleted

## 2015-03-06 MED ORDER — NITROFURANTOIN MONOHYD MACRO 100 MG PO CAPS: 100.0000 mg | ORAL_CAPSULE | Freq: Two times a day (BID) | ORAL | Status: DC

## 2015-03-07 ENCOUNTER — Other Ambulatory Visit: Payer: Self-pay | Admitting: Gynecology

## 2015-03-07 ENCOUNTER — Ambulatory Visit (INDEPENDENT_AMBULATORY_CARE_PROVIDER_SITE_OTHER): Payer: Medicare Other

## 2015-03-07 ENCOUNTER — Encounter: Payer: Self-pay | Admitting: Gynecology

## 2015-03-07 DIAGNOSIS — M8580 Other specified disorders of bone density and structure, unspecified site: Secondary | ICD-10-CM

## 2015-03-07 DIAGNOSIS — M899 Disorder of bone, unspecified: Secondary | ICD-10-CM

## 2015-03-07 DIAGNOSIS — M8589 Other specified disorders of bone density and structure, multiple sites: Secondary | ICD-10-CM

## 2015-03-07 DIAGNOSIS — Z01419 Encounter for gynecological examination (general) (routine) without abnormal findings: Secondary | ICD-10-CM

## 2015-05-01 ENCOUNTER — Other Ambulatory Visit: Payer: Self-pay | Admitting: Cardiology

## 2015-05-01 DIAGNOSIS — M797 Fibromyalgia: Secondary | ICD-10-CM

## 2015-05-01 DIAGNOSIS — Z8669 Personal history of other diseases of the nervous system and sense organs: Secondary | ICD-10-CM

## 2015-05-01 NOTE — Telephone Encounter (Signed)
[facility] requesting a refill on Fiorinal 50-325-40 mg capsule. Please advise

## 2015-05-02 MED ORDER — BUTALBITAL-ASA-CAFFEINE 50-325-40 MG PO CAPS: 1.0000 | ORAL_CAPSULE | ORAL | Status: DC | PRN

## 2015-05-03 NOTE — Telephone Encounter (Signed)
Discussed with  Dr. Mare Ferrari on 2/13 and ok to fill Rx was called to pharmacy

## 2015-05-16 DIAGNOSIS — M5416 Radiculopathy, lumbar region: Secondary | ICD-10-CM | POA: Diagnosis not present

## 2015-05-16 DIAGNOSIS — M545 Low back pain: Secondary | ICD-10-CM | POA: Diagnosis not present

## 2015-05-23 ENCOUNTER — Ambulatory Visit: Payer: Medicare Other | Admitting: Physician Assistant

## 2015-05-23 ENCOUNTER — Encounter: Payer: Self-pay | Admitting: Cardiology

## 2015-05-23 ENCOUNTER — Ambulatory Visit (INDEPENDENT_AMBULATORY_CARE_PROVIDER_SITE_OTHER): Payer: Medicare Other | Admitting: Cardiology

## 2015-05-23 ENCOUNTER — Other Ambulatory Visit: Payer: Medicare Other

## 2015-05-23 VITALS — BP 122/72 | HR 76 | Ht 63.5 in | Wt 132.1 lb

## 2015-05-23 DIAGNOSIS — E7800 Pure hypercholesterolemia, unspecified: Secondary | ICD-10-CM

## 2015-05-23 DIAGNOSIS — I341 Nonrheumatic mitral (valve) prolapse: Secondary | ICD-10-CM | POA: Diagnosis not present

## 2015-05-23 DIAGNOSIS — R0602 Shortness of breath: Secondary | ICD-10-CM | POA: Diagnosis not present

## 2015-05-23 DIAGNOSIS — R51 Headache: Secondary | ICD-10-CM | POA: Diagnosis not present

## 2015-05-23 DIAGNOSIS — R519 Headache, unspecified: Secondary | ICD-10-CM

## 2015-05-23 LAB — LIPID PANEL
CHOL/HDL RATIO: 6.3 ratio — AB (ref <=5.0)
Cholesterol: 291 mg/dL — ABNORMAL HIGH (ref 125–200)
HDL: 46 mg/dL (ref >=46)
LDL CALC: 197 mg/dL — AB (ref <130)
Triglycerides: 241 mg/dL — ABNORMAL HIGH (ref <150)
VLDL: 48 mg/dL — ABNORMAL HIGH (ref <30)

## 2015-05-23 LAB — HEPATIC FUNCTION PANEL
ALBUMIN: 4.2 g/dL (ref 3.6–5.1)
ALT: 17 U/L (ref 6–29)
AST: 23 U/L (ref 10–35)
Alkaline Phosphatase: 49 U/L (ref 33–130)
BILIRUBIN TOTAL: 0.5 mg/dL (ref 0.2–1.2)
Bilirubin, Direct: 0.1 mg/dL (ref <=0.2)
Indirect Bilirubin: 0.4 mg/dL (ref 0.2–1.2)
Total Protein: 6.7 g/dL (ref 6.1–8.1)

## 2015-05-23 NOTE — Patient Instructions (Signed)
Medication Instructions:  Your physician recommends that you continue on your current medications as directed. Please refer to the Current Medication list given to you today.   Labwork: TODAY:  LIPID PANEL & HEPATIC   Testing/Procedures: None ordered  Follow-Up: Your physician wants you to follow-up in: [place] DR. CRENSHAW (EST NEW PT)   You will receive a reminder letter in the mail two months in advance. If you don't receive a letter, please call our office to schedule the follow-up appointment.   Any Other Special Instructions Will Be Listed Below (If Applicable).     If you need a refill on your cardiac medications before your next appointment, please call your pharmacy.

## 2015-05-23 NOTE — Addendum Note (Signed)
Addended by: Gaetano Net on: 05/23/2015 09:40 AM   Modules accepted: Orders

## 2015-05-23 NOTE — Progress Notes (Signed)
05/23/2015 Shelby [name]   Jan 16, 1941  [id number]  Primary Physician Warren Danes, MD Primary Cardiologist: Dr. Mare Ferrari   Reason for Visit/CC: 4 month F/u for HLD  HPI:  The patient is a 75 y/o female previously followed by Dr. Mare Ferrari for HLD and h/o mitral valve prolapse. Dr. Mare Ferrari also severed as her PCP. Other than h/o MVP, she has no other significant cardiac history. No h/o MI or CAD. Her PMH is also significant for breast cancer of the right breast, s/p mastectomy with reconstruction. She also in the summer of 2012 on a routine colonoscopy was found to have cancer of the right colon which was asymptomatic and this was successfully removed. All lymph nodes were negative and she did not require any subsequent chemotherapy. She had a recent colonoscopy 12/2013 that was negative. 3 year f/u recommended, due 12/2016.  She was last seen by Dr. Mare Ferrari 01/2015 and she was doing well w/o complaints. FLP showed elevated LDL of 179 mg/dL. He advised that she try to exercise and watch dietary cholesterol closely. He ordered for her to return in 4 months for repeat f/u and for repeat assessment of lipids and hepatic function.   Today in clinic, she reports that she has done well. She denies any chest pain, dyspnea, orthopnea, PND, LEE, weight gain, palpitations, dizziness, syncope, near syncope. No exertional symptoms. She has no complaints.    Current Outpatient Prescriptions  Medication Sig Dispense Refill  . amitriptyline (ELAVIL) 50 MG tablet Take 50-100 mg by mouth at bedtime as needed for sleep (sleep or headache).    . Ascorbic Acid (VITAMIN C) 1000 MG tablet Take 1,000 mg by mouth daily.    Marland Kitchen atenolol (TENORMIN) 25 MG tablet Take 25-50 mg by mouth daily.    . butalbital-aspirin-caffeine (FIORINAL) 50-325-40 MG capsule Take 1 capsule by mouth every 4 (four) hours as needed for headache (headache). 30 capsule 1  . Calcium Carbonate-Vitamin D (CALCIUM 600 + D PO) Take 1 tablet  by mouth 2 (two) times daily.     . diazepam (VALIUM) 5 MG tablet Take 5 mg by mouth as needed for anxiety.    . Omega-3 Fatty Acids (FISH OIL) 1200 MG CAPS Take 1,200 mg by mouth daily.    . vitamin B-12 (CYANOCOBALAMIN) 1000 MCG tablet Take 1,000 mcg by mouth daily.     No current facility-administered medications for this visit.    Allergies  Allergen Reactions  . Latex Rash  . Benzonatate     REACTION: migraines  . Codeine     REACTION: Nausea  . Morphine And Related     itching  . Skelaxin [Metaxalone] Other (See Comments)    "Makes Pt feel crazy"  . Penicillins Rash  . Sulfonamide Derivatives Rash    Social History   Social History  . Marital Status: Married    Spouse Name: N/A  . Number of Children: N/A  . Years of Education: N/A   Occupational History  . Not on file.   Social History Main Topics  . Smoking status: Never Smoker   . Smokeless tobacco: Never Used  . Alcohol Use: 0.6 oz/week    1 Glasses of wine per week  . Drug Use: No  . Sexual Activity: No     Comment: 1st intercourse 75 yo-Fewer than 5 partners   Other Topics Concern  . Not on file   Social History Narrative     Review of Systems: General: negative for chills, fever, night sweats  or weight changes.  Cardiovascular: negative for chest pain, dyspnea on exertion, edema, orthopnea, palpitations, paroxysmal nocturnal dyspnea or shortness of breath Dermatological: negative for rash Respiratory: negative for cough or wheezing Urologic: negative for hematuria Abdominal: negative for nausea, vomiting, diarrhea, bright red blood per rectum, melena, or hematemesis Neurologic: negative for visual changes, syncope, or dizziness All other systems reviewed and are otherwise negative except as noted above.    Blood pressure 122/72, pulse 76, height 5' 3.5" (1.613 m), weight 132 lb 1.9 oz (59.929 kg).  General appearance: alert, cooperative and no distress Neck: no carotid bruit and no  JVD Lungs: clear to auscultation bilaterally Heart: regular rate and rhythm, S1, S2 normal, no murmur, click, rub or gallop Extremities: no LEE Pulses: 2+ and symmetric Skin: warm and dry Neurologic: Grossly normal  EKG NSR 76 bpm. No ischemia.   ASSESSMENT AND PLAN:   1. HLD: Poorly controlled at time of last office visit at 179 mg/dL. Lifestyle modification initially recommended through diet and exercise. Per Dr. Sherryl Barters recommendation, patient is due for fasting lipid panel today to assess lipids. We will also order HFTs. If no significant reduction in LDL, will recommend medical therapy. Patient reports past history of statin intolerance. She has tried multiple statins. May consider referral to lipid clinic for consideration for PSK9 inhibitor.  2. H/o suspected MVP: this is outlined in Dr. Bobbye Riggs prior notes. She has remained asymptomatic w/o dyspnea, fatigue and chest pain. No significant murmurs of clicks on exam today.  3. H/o Breast Cancer: s/p right mastectomy + reconstruction.   4. H/c Colon Cancer: s/p partial colectomy. Followed every 3 years by colonoscopy.    PLAN Patient is doing well from a cardiac standpoint. EKG shows NSR w/o ischemia. BP and HR are both well controlled. As outlined above will check FLP today to assess lipids. She will need medical therapy with a statin or PSK9 inhibitor if still poorly controlled.  Will transfer care to a new cardiologist given Dr. Sherryl Barters retirement. Per patient request, will transfer to [facility], as she lives closer to that office. F/u in 6 months. Patient also advised that she will need to establish care with a new PCP. She was provided contact info for [facility].   Lyda Jester PA-C 05/23/2015 8:42 AM

## 2015-05-24 DIAGNOSIS — M545 Low back pain: Secondary | ICD-10-CM | POA: Diagnosis not present

## 2015-05-24 LAB — VITAMIN D 25 HYDROXY (VIT D DEFICIENCY, FRACTURES): VIT D 25 HYDROXY: 30 ng/mL (ref 30–100)

## 2015-05-31 DIAGNOSIS — M5416 Radiculopathy, lumbar region: Secondary | ICD-10-CM | POA: Diagnosis not present

## 2015-05-31 DIAGNOSIS — M545 Low back pain: Secondary | ICD-10-CM | POA: Diagnosis not present

## 2015-05-31 DIAGNOSIS — M5136 Other intervertebral disc degeneration, lumbar region: Secondary | ICD-10-CM | POA: Diagnosis not present

## 2015-06-01 ENCOUNTER — Ambulatory Visit: Payer: Medicare Other | Admitting: Primary Care

## 2015-07-05 ENCOUNTER — Other Ambulatory Visit: Payer: Self-pay | Admitting: Cardiology

## 2015-08-09 ENCOUNTER — Encounter: Payer: Self-pay | Admitting: Family Medicine

## 2015-08-09 ENCOUNTER — Ambulatory Visit (INDEPENDENT_AMBULATORY_CARE_PROVIDER_SITE_OTHER): Payer: Medicare Other | Admitting: Family Medicine

## 2015-08-09 VITALS — BP 136/82 | HR 71 | Temp 98.2°F | Resp 12 | Ht 63.5 in | Wt 135.6 lb

## 2015-08-09 DIAGNOSIS — I059 Rheumatic mitral valve disease, unspecified: Secondary | ICD-10-CM

## 2015-08-09 DIAGNOSIS — G4700 Insomnia, unspecified: Secondary | ICD-10-CM | POA: Insufficient documentation

## 2015-08-09 DIAGNOSIS — Z85828 Personal history of other malignant neoplasm of skin: Secondary | ICD-10-CM | POA: Diagnosis not present

## 2015-08-09 DIAGNOSIS — H04123 Dry eye syndrome of bilateral lacrimal glands: Secondary | ICD-10-CM | POA: Diagnosis not present

## 2015-08-09 DIAGNOSIS — E7800 Pure hypercholesterolemia, unspecified: Secondary | ICD-10-CM | POA: Diagnosis not present

## 2015-08-09 DIAGNOSIS — H2513 Age-related nuclear cataract, bilateral: Secondary | ICD-10-CM | POA: Diagnosis not present

## 2015-08-09 DIAGNOSIS — M797 Fibromyalgia: Secondary | ICD-10-CM

## 2015-08-09 DIAGNOSIS — G43009 Migraine without aura, not intractable, without status migrainosus: Secondary | ICD-10-CM

## 2015-08-09 DIAGNOSIS — Z0100 Encounter for examination of eyes and vision without abnormal findings: Secondary | ICD-10-CM | POA: Diagnosis not present

## 2015-08-09 NOTE — Progress Notes (Signed)
Subjective:    Patient ID: Shelby [name], female    DOB: 1940-07-11, 75 y.o.   MRN: [id number]  HPI   Shelby [name] is a 75 y.o.female here today to establish care with me, former Dr Mare Ferrari, who also was her cardiologists and now retired.  She has Hx of fibromyalgia, mitral valve prolapse, hyperlipidemia, and migraine headaches among some. She denies Hx of HTN, she is on Atenolol 25 mg for mitral valve prolapse.  Hx of breast cancer and colon cancer (2012), s/p mastectomy and right colectomy respectively.  Last routine physical was 05/2015.  She tries to follow a healthy diet and does not exercises regularly. She lives with husband, who has bipolar disorder. Independent ADL's and IADL's.  Her son lives in [place].  Concerns today : she would to be able to fill her medications when needed.  Fibromyalgia: Diagnosis a few years ago, she has refused to try Lyrica because afraid of side effects. According to patient, she tried Cymbalta but didn't help. Currently she takes Valium as needed for muscle pain. She takes amitriptyline for insomnia. She does not exercise regularly. + Arthralgias, mainly anterior hip pain, bilateral and intermittent. Reports having PET scan and lumbar MRI negative + gyn evaluation done to evaluate for metastatic lesions given her Hx of cancer. S/P hysterectomy.   She takes Fiorinal, which was prescribed for headaches, instead she takes it as needed for musculoskeletal pain, 2-3 times per months, helps. Otherwise problem is stable, it does not limit her daily activities.   Migraine headaches: Dx years ago with "idiopathic stabbing headache."  No associated photo-phonophobia, nausea, or vomiting, and no aura. Headaches are reported as severe. 3 years ago was seen at the headache clinic, Had temporal artery Bx and underwent a series of shot on scalp, last migraine about a year.  Hx of vertigo, intermittent for a while, started wearing a  bracelet and "PBS" and has not had any episode for 2 months.   Hyperlipidemia: She is on non pharmacologic treatment. Following a low fat diet but no consistently. She  has had side effects with statins.   Lab Results  Component Value Date   CHOL 291* 05/23/2015   HDL 46 05/23/2015   LDLCALC 197* 05/23/2015   LDLDIRECT 198.0 09/07/2014   TRIG 241* 05/23/2015   CHOLHDL 6.3* 05/23/2015     Chemistry      Component Value Date/Time   NA 136 01/20/2015 0836   K 4.8 01/20/2015 0836   CL 101 01/20/2015 0836   CO2 25 01/20/2015 0836   BUN 14 01/20/2015 0836   CREATININE 0.93 01/20/2015 0836   CREATININE 0.86 09/07/2014 0843      Component Value Date/Time   CALCIUM 9.6 01/20/2015 0836   ALKPHOS 49 05/23/2015 0941   AST 23 05/23/2015 0941   ALT 17 05/23/2015 0941   BILITOT 0.5 05/23/2015 0941      Insomnia: She has been on Amitriptyline at bedtime for about 30 years, started with 100 mg and now taking 50 mg. She denies any side effect and it helps with sleep, she feels rested next day.  Denies depression or anxiety.   She had a son with bipolar disorder, who died from neuroendocrine carcinoma.    Review of Systems  Constitutional: Negative for fever, activity change, appetite change, fatigue and unexpected weight change.  HENT: Positive for rhinorrhea (Hx of seasonal allergies.). Negative for mouth sores, nosebleeds and trouble swallowing.   Eyes: Negative for redness and visual  disturbance.  Respiratory: Negative for cough, shortness of breath and wheezing.   Cardiovascular: Negative for chest pain, palpitations and leg swelling.  Gastrointestinal: Negative for nausea, vomiting, abdominal pain and blood in stool.       Negative for changes in bowel habits.  Genitourinary: Negative for dysuria, hematuria, decreased urine volume and difficulty urinating.  Musculoskeletal: Positive for myalgias, back pain and arthralgias.  Neurological: Negative for seizures, syncope,  weakness, numbness and headaches.  Hematological: Negative for adenopathy.  Psychiatric/Behavioral: Positive for sleep disturbance (controlled with medication.). Negative for behavioral problems. The patient is not nervous/anxious.      Current Outpatient Prescriptions on File Prior to Visit  Medication Sig Dispense Refill  . amitriptyline (ELAVIL) 50 MG tablet TAKE 1 OR 2 TABLETS AT BEDTIME AS NEEDED. 60 tablet 2  . Ascorbic Acid (VITAMIN C) 1000 MG tablet Take 1,000 mg by mouth daily.    Marland Kitchen atenolol (TENORMIN) 25 MG tablet Take 25-50 mg by mouth daily.    . butalbital-aspirin-caffeine (FIORINAL) 50-325-40 MG capsule Take 1 capsule by mouth every 4 (four) hours as needed for headache (headache). 30 capsule 1  . Calcium Carbonate-Vitamin D (CALCIUM 600 + D PO) Take 1 tablet by mouth 2 (two) times daily.     . diazepam (VALIUM) 5 MG tablet Take 5 mg by mouth as needed for anxiety.    . Omega-3 Fatty Acids (FISH OIL) 1200 MG CAPS Take 1,200 mg by mouth daily.    . vitamin B-12 (CYANOCOBALAMIN) 1000 MCG tablet Take 1,000 mcg by mouth daily.     No current facility-administered medications on file prior to visit.     Past Medical History  Diagnosis Date  . MVP (mitral valve prolapse)   . Eosinophilia   . Syncope     secondary to vasovagal phenomenon  . Hypercholesterolemia     statin intolerant  . Heart murmur     mitral valve prolapse  . Depression   . Breast cancer ([place]) 2001    s/p surgery and chemo   . Colon cancer ([place]) 2012  . Allergy   . Arthritis   . Asthma   . Migraine   . Osteopenia 02/2015    T score -1.8 FRAX 11%/2.4%    Social History   Social History  . Marital Status: Married    Spouse Name: N/A  . Number of Children: N/A  . Years of Education: N/A   Social History Main Topics  . Smoking status: Never Smoker   . Smokeless tobacco: Never Used  . Alcohol Use: 0.6 oz/week    1 Glasses of wine per week  . Drug Use: No  . Sexual Activity: No     Comment:  1st intercourse 75 yo-Fewer than 5 partners   Other Topics Concern  . None   Social History Narrative    Filed Vitals:   08/09/15 0807  BP: 136/82  Pulse: 71  Temp: 98.2 F (36.8 C)  Resp: 12   Body mass index is 23.64 kg/(m^2).  SpO2 Readings from Last 3 Encounters:  08/09/15 96%  06/12/12 96%  06/10/12 100%        Objective:   Physical Exam  Constitutional: She is oriented to person, place, and time. She appears well-developed and well-nourished. No distress.  HENT:  Head: Atraumatic.  Mouth/Throat: Uvula is midline, oropharynx is clear and moist and mucous membranes are normal.  Eyes: Conjunctivae and EOM are normal. Pupils are equal, round, and reactive to light.  Neck: No  JVD present. No thyroid mass present.  Cardiovascular: Normal rate, regular rhythm and S1 normal.   No murmur heard. Pulses:      Dorsalis pedis pulses are 2+ on the right side, and 2+ on the left side.       Posterior tibial pulses are 2+ on the right side, and 2+ on the left side.  Pulmonary/Chest: Effort normal and breath sounds normal. She has no wheezes. She has no rales. She exhibits tenderness.  Abdominal: Soft. She exhibits no mass. There is no hepatomegaly. There is no tenderness.  Musculoskeletal: She exhibits tenderness. She exhibits no edema.  + Trigger points on upper and lower back, chest wall, and extremities. Hips ROM adequate, no pain elicited. No major deformity of signs of synovitis.  Lymphadenopathy:    She has no cervical adenopathy.  Neurological: She is alert and oriented to person, place, and time. She has normal strength. No cranial nerve deficit. Coordination and gait normal.  Stable gait with no assistance needed.  Skin: Skin is warm. No rash noted.  Psychiatric: She has a normal mood and affect. Her speech is normal.  Well groomed, good eye contact.  Nursing note and vitals reviewed.       Assessment & Plan:   Shelby [name] was seen today for establish  care.  Diagnoses and all orders for this visit:  Migraine without aura and without status migrainosus, not intractable Reporting improvement, no episodes for the past year. I explained that Fiorinal is for headaches/migraines not for musculoskeletal pain. Follow-up in 12 months, before if needed.  Insomnia, unspecified Well controlled with Amitriptyline. Good sleep hygiene.  We discussed risk of this medication + others she is taking, concerns mainly among patients 97 and older.  Since current management has helped and she is tolerating well I agree to continue filling them, close monitoring recommended.  Hypercholesterolemia Poor tolerance to statin. Low fat diet recommended.  F/U in 6-12 months.  Fibromyalgia  Stable. No changes in Valium, which I agreed to continue feeling as far as she takes it occasionally. Amitriptyline also might help. Some side effects of medications were discussed. Regular, low impact exercise was recommended. Good sleep hygiene.  Mitral valve disorder  Asymptomatic. Echo in 09/2014, I cannot find report. No further recommendations in this regard. Continue Atenolol.     -Patient advised to return sooner than planned if new concerns arise.     Crista Nuon G. Martinique, MD  [facility]. [place] office.

## 2015-08-09 NOTE — Patient Instructions (Addendum)
A few things to remember from today's visit:   1. Mitral valve disorder   2. Hypercholesterolemia   3. Migraine without aura and without status migrainosus, not intractable   4. Fibromyalgia    A few tips:  -As we age balance is not as good as it was, so there is a higher risks for falls. Please remove small rugs and furniture that is "in your way" and could increase the risk of falls. Stretching exercises may help with fall prevention: Yoga and Tai Chi are some examples. Low impact exercise is better, so you are not very achy the next day.  -Sun screen and avoidance of direct sun light recommended. Caution with dehydration, if working outdoors be sure to drink enough fluids.  -Healthy diet low in red meet/animal fat and sugar + regular physical activity is recommended.    Caution with medication, I agree to continue filling them since you do not take it often.   If you sign-up for My chart, you can communicate easier with [place] in case you have any question or concern.

## 2015-08-10 ENCOUNTER — Ambulatory Visit: Payer: Medicare Other | Admitting: Family Medicine

## 2015-09-12 ENCOUNTER — Other Ambulatory Visit: Payer: Self-pay

## 2015-09-12 MED ORDER — ATENOLOL 25 MG PO TABS: 25.0000 mg | ORAL_TABLET | Freq: Every day | ORAL | Status: DC

## 2015-10-25 ENCOUNTER — Other Ambulatory Visit: Payer: Self-pay | Admitting: Gynecology

## 2015-10-25 DIAGNOSIS — Z1231 Encounter for screening mammogram for malignant neoplasm of breast: Secondary | ICD-10-CM

## 2015-11-28 ENCOUNTER — Ambulatory Visit: Payer: Medicare Other

## 2015-11-28 ENCOUNTER — Ambulatory Visit
Admission: RE | Admit: 2015-11-28 | Discharge: 2015-11-28 | Disposition: A | Discharge status: Home / Self Care | Payer: Medicare Other | Source: Ambulatory Visit | Attending: Gynecology | Admitting: Gynecology

## 2015-11-28 DIAGNOSIS — Z1231 Encounter for screening mammogram for malignant neoplasm of breast: Secondary | ICD-10-CM

## 2015-12-29 ENCOUNTER — Telehealth: Payer: Self-pay | Admitting: Cardiology

## 2016-01-02 MED ORDER — AMITRIPTYLINE HCL 50 MG PO TABS: ORAL_TABLET | ORAL | 1 refills | Status: DC

## 2016-01-02 NOTE — Telephone Encounter (Signed)
Rx has been sent in. 

## 2016-01-02 NOTE — Telephone Encounter (Signed)
[place] sent this to the wrong Dr Martinique. (Dr Peter Martinique) Pt is leaving town today and needs this afternoon or asap.  amitriptyline (ELAVIL) 50 MG tablet 60 tabs w/ refills

## 2016-01-02 NOTE — Addendum Note (Signed)
Addended by: Kateri Mc E on: 01/02/2016 09:21 AM   Modules accepted: Orders

## 2016-02-12 ENCOUNTER — Ambulatory Visit: Payer: Medicare Other | Admitting: Family Medicine

## 2016-02-19 ENCOUNTER — Encounter: Payer: Self-pay | Admitting: Family Medicine

## 2016-02-19 ENCOUNTER — Ambulatory Visit (INDEPENDENT_AMBULATORY_CARE_PROVIDER_SITE_OTHER): Payer: Medicare Other | Admitting: Family Medicine

## 2016-02-19 VITALS — BP 130/70 | HR 86 | Temp 97.9°F | Resp 12 | Ht 60.0 in | Wt 135.8 lb

## 2016-02-19 DIAGNOSIS — I059 Rheumatic mitral valve disease, unspecified: Secondary | ICD-10-CM

## 2016-02-19 DIAGNOSIS — G43009 Migraine without aura, not intractable, without status migrainosus: Secondary | ICD-10-CM

## 2016-02-19 DIAGNOSIS — M797 Fibromyalgia: Secondary | ICD-10-CM

## 2016-02-19 DIAGNOSIS — E782 Mixed hyperlipidemia: Secondary | ICD-10-CM | POA: Diagnosis not present

## 2016-02-19 DIAGNOSIS — G4700 Insomnia, unspecified: Secondary | ICD-10-CM | POA: Diagnosis not present

## 2016-02-19 MED ORDER — AMITRIPTYLINE HCL 50 MG PO TABS: 50.0000 mg | ORAL_TABLET | Freq: Every evening | ORAL | 2 refills | Status: DC | PRN

## 2016-02-19 MED ORDER — ATENOLOL 25 MG PO TABS: 25.0000 mg | ORAL_TABLET | Freq: Every day | ORAL | 2 refills | Status: DC

## 2016-02-19 NOTE — Patient Instructions (Signed)
A few things to remember from today's visit:   Mixed hyperlipidemia  Insomnia, unspecified type  Migraine without aura and without status migrainosus, not intractable  Fibromyalgia - Plan: VITAMIN D 25 Hydroxy (Vit-D Deficiency, Fractures)   [name], today we have followed on some of your chronic medical problems and they seem to be stable, so no changes in current management today.  Review medication list and be sure if is accurate.  -Remember a healthy diet and regular physical activity are very important for prevention as well as for well being; they also help with many chronic problems, decreasing the need of adding new medications and delaying or preventing possible complications.  Remember some of your meds are contraindicated for > 9 years old. Migraine med just for headache.   Remember to arrange your follow up appt before leaving today. Please follow sooner than planned if a new concern arises.   Please be sure medication list is accurate. If a new problem present, please set up appointment sooner than planned today.

## 2016-02-19 NOTE — Progress Notes (Signed)
HPI:   [name] is a 75 y.o. female, who is here today to follow on some of her chronic medical problems.  Migraine headache:  She takes Fiorinal 50-325-40 mg as needed.  She has not had an episode of migraine head since last OV.  Fibromyalgia: She takes Valium 5 mg as needed for acute exacerbations, she has had about 3 episodes of severe myalgia, mid back pain. She states that pain localization varies, it is severe, exacerbated by movement and alleviated by rest.Sometiems she stays in bed until pain resolves, 2-3 days. She is also taking Fiorinal for this problem and with Valium. Last OV I recommended taking Fiorinal just for migraine headaches.   She states that she has "plenty" of Valium and Fiorinal, no refills needed today.  HLD:  She has tried "all medications"  to treat hyperlipidemia but has not tolerated well: Severe myalgias, arthralgias, and skin pruritus. She states that she would not be willing to try another one. She is not consistent with a low fat diet.   Lab Results  Component Value Date   CHOL 291 (H) 05/23/2015   HDL 46 05/23/2015   LDLCALC 197 (H) 05/23/2015   LDLDIRECT 198.0 09/07/2014   TRIG 241 (H) 05/23/2015   CHOLHDL 6.3 (H) 05/23/2015    Hx of mitral valve prolapse, she is on Atenolol 25 mg daily. No Hx of HTN or CAD. Denies visual changes, chest pain, dyspnea, palpitation, claudication, focal weakness, or edema. No orthopnea or PND.  She is also on Amitriptyline 50 mg at bedtime, which was prescribed for insomnia. Tolerating well. She has not noted side effects and medication still helping.  No falls since her last OV.  Vit D has been on lower normal range. She is on OTC Vit D 800 U daily.   No new concerns today.     Review of Systems  Constitutional: Negative for activity change, appetite change, fatigue, fever and unexpected weight change.  HENT: Negative for mouth sores, nosebleeds and trouble  swallowing.   Eyes: Negative for pain and visual disturbance.  Respiratory: Negative for cough, shortness of breath and wheezing.   Cardiovascular: Negative for chest pain, palpitations and leg swelling.  Gastrointestinal: Negative for abdominal pain, nausea and vomiting.       Negative for changes in bowel habits.  Genitourinary: Negative for decreased urine volume, difficulty urinating, dysuria and hematuria.  Musculoskeletal: Positive for myalgias. Negative for gait problem.  Neurological: Negative for syncope, weakness and headaches.  Psychiatric/Behavioral: Positive for sleep disturbance. Negative for confusion.      Current Outpatient Prescriptions on File Prior to Visit  Medication Sig Dispense Refill  . Ascorbic Acid (VITAMIN C) 1000 MG tablet Take 1,000 mg by mouth daily.    . butalbital-aspirin-caffeine (FIORINAL) 50-325-40 MG capsule Take 1 capsule by mouth every 4 (four) hours as needed for headache (headache). 30 capsule 1  . Calcium Carbonate-Vitamin D (CALCIUM 600 + D PO) Take 1 tablet by mouth 2 (two) times daily.     . diazepam (VALIUM) 5 MG tablet Take 5 mg by mouth as needed for anxiety.    . Omega-3 Fatty Acids (FISH OIL) 1200 MG CAPS Take 1,200 mg by mouth daily.    . vitamin B-12 (CYANOCOBALAMIN) 1000 MCG tablet Take 1,000 mcg by mouth daily.     No current facility-administered medications on file prior to visit.      Past Medical History:  Diagnosis Date  . Allergy   .  Arthritis   . Asthma   . Breast cancer ([place]) 2001   s/p surgery and chemo   . Colon cancer ([place]) 2012  . Depression   . Eosinophilia   . Heart murmur    mitral valve prolapse  . Hypercholesterolemia    statin intolerant  . Migraine   . MVP (mitral valve prolapse)   . Osteopenia 02/2015   T score -1.8 FRAX 11%/2.4%  . Syncope    secondary to vasovagal phenomenon   Allergies  Allergen Reactions  . Latex Rash  . Benzonatate     REACTION: migraines  . Codeine     REACTION:  Nausea  . Morphine And Related     itching  . Skelaxin [Metaxalone] Other (See Comments)    "Makes Pt feel crazy"  . Penicillins Rash  . Sulfonamide Derivatives Rash    Social History   Social History  . Marital status: Married    Spouse name: N/A  . Number of children: N/A  . Years of education: N/A   Social History Main Topics  . Smoking status: Never Smoker  . Smokeless tobacco: Never Used  . Alcohol use 0.6 oz/week    1 Glasses of wine per week  . Drug use: No  . Sexual activity: No     Comment: 1st intercourse 75 yo-Fewer than 5 partners   Other Topics Concern  . None   Social History Narrative  . None    Vitals:   02/19/16 1522  BP: 130/70  Pulse: 86  Resp: 12  Temp: 97.9 F (36.6 C)    O2 sat at RA 97%  Body mass index is 26.52 kg/m.    Physical Exam  Nursing note and vitals reviewed. Constitutional: She is oriented to person, place, and time. She appears well-developed and well-nourished. No distress.  HENT:  Head: Atraumatic.  Mouth/Throat: Oropharynx is clear and moist and mucous membranes are normal.  Eyes: Conjunctivae and EOM are normal. Pupils are equal, round, and reactive to light.  Cardiovascular: Normal rate and regular rhythm.   No murmur heard. Pulses:      Dorsalis pedis pulses are 2+ on the right side, and 2+ on the left side.  Respiratory: Effort normal and breath sounds normal. No respiratory distress.  GI: Soft. She exhibits no mass. There is no hepatomegaly. There is no tenderness.  Musculoskeletal: She exhibits no edema or tenderness.  No trigger points.  Neurological: She is alert and oriented to person, place, and time. She has normal strength. No cranial nerve deficit. Coordination and gait normal.  Skin: Skin is warm. No erythema.  Psychiatric: She has a normal mood and affect. Cognition and memory are normal.  Well groomed, good eye contact.      ASSESSMENT AND PLAN:     Shelby [name] was seen today for  follow-up.  Diagnoses and all orders for this visit:  Insomnia, unspecified type  Stable. I recommend trying 1/2 tab  A few times to see if still helps her sleep. Some side effects discussed. Good sleep hygiene. F/U in 6 months.  -     amitriptyline (ELAVIL) 50 MG tablet; Take 1 tablet (50 mg total) by mouth at bedtime as needed for sleep.  Mixed hyperlipidemia  Not well controlled on non pharmacologic treat,]ment. She is not interested in trying other medications and not interested in re-checking FLP. Explained that we have new medications since she tried last, Livalo could be a good options.  Low fat diet discussed and recommended.  Migraine without aura and without status migrainosus, not intractable  Well controlled. Amitriptyline and Atenolol may be also helping. Explained that Fiorinal is indicated for migraine headache, avoid using it for other pains. We discussed some side effects.  -     amitriptyline (ELAVIL) 50 MG tablet; Take 1 tablet (50 mg total) by mouth at bedtime as needed for sleep.  Fibromyalgia  Stable. No changes in Valium, side effects discussed. Low impact exercise. Fall precautions. Amitriptyline may also be helping. F/U in 6 months.   -     VITAMIN D 25 Hydroxy (Vit-D Deficiency, Fractures)  Mitral valve disorder  Asymptomatic. No changes in current management. F/U in 12 months.   -     atenolol (TENORMIN) 25 MG tablet; Take 1 tablet (25 mg total) by mouth daily.       -Ms. Shelby [name] was advised to return sooner than planned today if new concerns arise.       Betty G. Martinique, MD  [facility]. [place] office.

## 2016-02-19 NOTE — Progress Notes (Signed)
Pre visit review using our clinic review tool, if applicable. No additional management support is needed unless otherwise documented below in the visit note. 

## 2016-02-20 ENCOUNTER — Encounter: Payer: Self-pay | Admitting: Family Medicine

## 2016-02-20 LAB — VITAMIN D 25 HYDROXY (VIT D DEFICIENCY, FRACTURES): VITD: 32.04 ng/mL (ref 30.00–100.00)

## 2016-02-23 DIAGNOSIS — M7061 Trochanteric bursitis, right hip: Secondary | ICD-10-CM | POA: Diagnosis not present

## 2016-02-26 ENCOUNTER — Ambulatory Visit: Payer: Medicare Other | Admitting: Family Medicine

## 2016-04-02 DIAGNOSIS — M7061 Trochanteric bursitis, right hip: Secondary | ICD-10-CM | POA: Diagnosis not present

## 2016-05-02 ENCOUNTER — Encounter: Payer: Medicare Other | Admitting: Gynecology

## 2016-05-19 NOTE — Progress Notes (Signed)
HPI:   [name] is a 76 y.o. female, who is here today for her routine physical.  Regular exercise 3 or more time per week: Not consistently. Following a healthy diet: Not consistently:pies,ice cream,cakes.   She lives with her husband in [place]. She comes to [place] with her husband about once per months, he has business in town.  ADL's and IADL's independent.  Chronic medical problems: Fibromyalgia,migraines,HLD, breast cancer (s/p right breast mastectomy with reconstruction), colon cancer (asymptomatic when found incidentally by colonoscopy in 2012 s/p colectomy, did not need chemo),and mitral valve prolapse (on Atenolol 25 mg daily).  She follows with cardiologists    -She takes Valium 5 mg as needed for exacerbations of fibromyalgia, about 3 times per months.She has not needed any since last OV.   -For migraine headaches she takes Fiorinal as needed, has not needed in a while. -Insomnia, takes Amitriptyline 50 mg at bedtime.  She has taken these medications and denies side effects.    HLD: She has not tolerated several statins in the past. Currently she is on low fat diet. She is taking "Grape seed", which is supposed to help with cholesterol.She has been taking it for 1 month.   Lab Results  Component Value Date   CHOL 291 (H) 05/23/2015   HDL 46 05/23/2015   LDLCALC 197 (H) 05/23/2015   LDLDIRECT 198.0 09/07/2014   TRIG 241 (H) 05/23/2015   CHOLHDL 6.3 (H) 05/23/2015   Last pap smear in 2011, no Hx of abnormal pap smear.She follows with gyn, Dr Shelby [name]. She has an appt today.  Mammogram: 11/2015 [place] 1. Colonoscopy: 12/2013 negative. DEXA: 02/2015, osteopenia.  FHx for gynecologic or colon cancer: Son colon cancer at 101 (died).  She has no concerns today.   Review of Systems  Constitutional: Negative for activity change, appetite change, fatigue, fever and unexpected weight change.  HENT: Negative for dental  problem, hearing loss, mouth sores, nosebleeds and trouble swallowing.   Eyes: Negative for pain and visual disturbance.  Respiratory: Negative for cough, shortness of breath and wheezing.   Cardiovascular: Negative for chest pain, palpitations and leg swelling.  Gastrointestinal: Negative for abdominal pain, nausea and vomiting.       Negative for changes in bowel habits.  Endocrine: Negative for polydipsia, polyphagia and polyuria.  Genitourinary: Negative for decreased urine volume, dysuria and hematuria.  Musculoskeletal: Positive for arthralgias and myalgias. Negative for gait problem.  Skin: Negative for rash and wound.  Neurological: Negative for syncope, weakness and headaches.  Hematological: Negative for adenopathy. Does not bruise/bleed easily.  Psychiatric/Behavioral: Positive for sleep disturbance. Negative for confusion. The patient is not nervous/anxious.       Current Outpatient Prescriptions on File Prior to Visit  Medication Sig Dispense Refill  . amitriptyline (ELAVIL) 50 MG tablet Take 1 tablet (50 mg total) by mouth at bedtime as needed for sleep. 90 tablet 2  . Ascorbic Acid (VITAMIN C) 1000 MG tablet Take 1,000 mg by mouth daily.    Marland Kitchen atenolol (TENORMIN) 25 MG tablet Take 1 tablet (25 mg total) by mouth daily. 90 tablet 2  . butalbital-aspirin-caffeine (FIORINAL) 50-325-40 MG capsule Take 1 capsule by mouth every 4 (four) hours as needed for headache (headache). 30 capsule 1  . Calcium Carbonate-Vitamin D (CALCIUM 600 + D PO) Take 1 tablet by mouth 2 (two) times daily.     . diazepam (VALIUM) 5 MG tablet Take 5 mg by mouth as needed for anxiety.    Marland Kitchen  Omega-3 Fatty Acids (FISH OIL) 1200 MG CAPS Take 1,200 mg by mouth daily.    . vitamin B-12 (CYANOCOBALAMIN) 1000 MCG tablet Take 1,000 mcg by mouth daily.     No current facility-administered medications on file prior to visit.      Past Medical History:  Diagnosis Date  . Allergy   . Arthritis   . Asthma   .  Breast cancer ([place]) 2001   s/p surgery and chemo   . Colon cancer ([place]) 2012  . Depression   . Eosinophilia   . Heart murmur    mitral valve prolapse  . Hypercholesterolemia    statin intolerant  . Migraine   . MVP (mitral valve prolapse)   . Osteopenia 02/2015   T score -1.8 FRAX 11%/2.4%  . Syncope    secondary to vasovagal phenomenon    Allergies  Allergen Reactions  . Latex Rash  . Benzonatate     REACTION: migraines  . Codeine     REACTION: Nausea  . Morphine And Related     itching  . Skelaxin [Metaxalone] Other (See Comments)    "Makes Pt feel crazy"  . Penicillins Rash  . Sulfonamide Derivatives Rash    Family History  Problem Relation Age of Onset  . Arthritis Mother   . Hyperlipidemia Mother   . Heart disease Father   . Heart attack Father   . Hypertension Father   . Cancer Son     rectal cancer-died age 76  . Breast cancer Other 36  . Diabetes Paternal Aunt   . Stroke Paternal Grandfather     Social History   Social History  . Marital status: Married    Spouse name: N/A  . Number of children: N/A  . Years of education: N/A   Social History Main Topics  . Smoking status: Never Smoker  . Smokeless tobacco: Never Used  . Alcohol use 0.6 oz/week    1 Glasses of wine per week  . Drug use: No  . Sexual activity: Not Currently     Comment: 1st intercourse 76 yo-Fewer than 5 partners   Other Topics Concern  . None   Social History Narrative  . None     Vitals:   05/20/16 0849  BP: 124/82  Pulse: 93  Resp: 12   Body mass index is 26.41 kg/m.  O2 sat 95% at RA.  Wt Readings from Last 3 Encounters:  05/20/16 135 lb 4 oz (61.3 kg)  02/19/16 135 lb 12.8 oz (61.6 kg)  08/09/15 135 lb 9.6 oz (61.5 kg)     Physical Exam  Nursing note and vitals reviewed. Constitutional: She is oriented to person, place, and time. She appears well-developed and well-nourished. No distress.  HENT:  Head: Atraumatic.  Right Ear: Hearing normal.    Left Ear: Hearing normal.  Mouth/Throat: Uvula is midline, oropharynx is clear and moist and mucous membranes are normal.  Eyes: Conjunctivae and EOM are normal. Pupils are equal, round, and reactive to light.  Neck: No JVD present. No thyroid mass and no thyromegaly present.  Cardiovascular: Normal rate and regular rhythm.   No murmur heard. Pulses:      Posterior tibial pulses are 2+ on the right side, and 2+ on the left side.  DP present bilateral. Normal capillary refill.  Respiratory: Effort normal and breath sounds normal. No respiratory distress.  GI: Soft. She exhibits no mass. There is no hepatomegaly. There is no tenderness.  Musculoskeletal: She exhibits no edema.  No major deformity or signs of synovitis appreciated.  Lymphadenopathy:    She has no cervical adenopathy.       Right: No supraclavicular adenopathy present.       Left: No supraclavicular adenopathy present.  Neurological: She is alert and oriented to person, place, and time. She has normal strength. No cranial nerve deficit. Coordination normal.  Reflex Scores:      Bicep reflexes are 2+ on the right side and 2+ on the left side.      Patellar reflexes are 2+ on the right side and 2+ on the left side. Stable gait with no assistance. Get up and go test < 30 seconds.  Skin: Skin is warm. No rash noted. No erythema.  Psychiatric: She has a normal mood and affect. Her speech is normal. Cognition and memory are normal.  Well groomed, good eye contact.      ASSESSMENT AND PLAN:   Chelsea was seen today for annual exam.  Diagnoses and all orders for this visit:  Routine general medical examination at a health care facility   We discussed the importance of regular physical activity and healthy diet for prevention of chronic illness and/or complications. Preventive guidelines reviewed. Vaccination:Prevnar reported as current, refused Influenza vaccine. Aspirin for primary prevention discussed, she  reports tinnitus when she took it before. Ca++ and vit D supplementation recommended. Fall prevention. Next CPE in 1 year.  -     Basic metabolic panel -     Lipid panel  Fibromyalgia  Stable. She has not needed Valium in months, so I think it is appropriate to follow annually,before if needed.Side effects of Valium discussed.  Low impact exercise.  Migraine without aura and without status migrainosus, not intractable  She has not had an episode in over 6 months, has not taken Fiorinal. Some side effects discussed. F/U in a year,before if needed.  Insomnia, unspecified type  Stable. We discussed some side effects of TCA, she has tolerated well for years. Good sleep hygiene. No changes in current management. F/U in 12 months.  Mixed hyperlipidemia  She has not tolerated several statins in the past. We dicussed benefits of statin, she may tolerate Livalo better, which she does not recall trying before. Low fat diet discussed and strongly recommended. Further recommendations will be given according to labs/imaging results.    -     Basic metabolic panel -     Lipid panel    Since she has not needed Fiorinal or Valium in over 6 months and tolerating Amitriptyline well, I thinks it is appropriate to follow annually, sooner if needed (depending on HLD or chronic problems exacerbation). She voices understanding and agrees with plan.    Return in 1 year (on 05/20/2017).          Betty G. Martinique, MD  [facility]. [place] office.

## 2016-05-20 ENCOUNTER — Encounter: Payer: Self-pay | Admitting: Family Medicine

## 2016-05-20 ENCOUNTER — Ambulatory Visit (INDEPENDENT_AMBULATORY_CARE_PROVIDER_SITE_OTHER): Payer: Medicare Other | Admitting: Gynecology

## 2016-05-20 ENCOUNTER — Encounter: Payer: Self-pay | Admitting: Gynecology

## 2016-05-20 ENCOUNTER — Ambulatory Visit (INDEPENDENT_AMBULATORY_CARE_PROVIDER_SITE_OTHER): Payer: Medicare Other | Admitting: Family Medicine

## 2016-05-20 VITALS — BP 118/76 | Ht 63.5 in | Wt 135.0 lb

## 2016-05-20 VITALS — BP 124/82 | HR 93 | Resp 12 | Ht 60.0 in | Wt 135.2 lb

## 2016-05-20 DIAGNOSIS — Z01411 Encounter for gynecological examination (general) (routine) with abnormal findings: Secondary | ICD-10-CM

## 2016-05-20 DIAGNOSIS — G4700 Insomnia, unspecified: Secondary | ICD-10-CM | POA: Diagnosis not present

## 2016-05-20 DIAGNOSIS — M797 Fibromyalgia: Secondary | ICD-10-CM

## 2016-05-20 DIAGNOSIS — M8580 Other specified disorders of bone density and structure, unspecified site: Secondary | ICD-10-CM

## 2016-05-20 DIAGNOSIS — G43009 Migraine without aura, not intractable, without status migrainosus: Secondary | ICD-10-CM | POA: Diagnosis not present

## 2016-05-20 DIAGNOSIS — N952 Postmenopausal atrophic vaginitis: Secondary | ICD-10-CM

## 2016-05-20 DIAGNOSIS — Z0000 Encounter for general adult medical examination without abnormal findings: Secondary | ICD-10-CM

## 2016-05-20 DIAGNOSIS — E782 Mixed hyperlipidemia: Secondary | ICD-10-CM | POA: Diagnosis not present

## 2016-05-20 LAB — LIPID PANEL
Cholesterol: 320 mg/dL — ABNORMAL HIGH (ref 0–200)
HDL: 46.8 mg/dL (ref >39.00)
NONHDL: 273.26
Total CHOL/HDL Ratio: 7
Triglycerides: 366 mg/dL — ABNORMAL HIGH (ref 0.0–149.0)
VLDL: 73.2 mg/dL — ABNORMAL HIGH (ref 0.0–40.0)

## 2016-05-20 LAB — LDL CHOLESTEROL, DIRECT: Direct LDL: 188 mg/dL

## 2016-05-20 LAB — BASIC METABOLIC PANEL
BUN: 16 mg/dL (ref 6–23)
CALCIUM: 10.4 mg/dL (ref 8.4–10.5)
CO2: 31 meq/L (ref 19–32)
Chloride: 99 mEq/L (ref 96–112)
Creatinine, Ser: 0.83 mg/dL (ref 0.40–1.20)
GFR: 70.94 mL/min (ref >60.00)
GLUCOSE: 87 mg/dL (ref 70–99)
Potassium: 4.7 mEq/L (ref 3.5–5.1)
SODIUM: 139 meq/L (ref 135–145)

## 2016-05-20 NOTE — Patient Instructions (Signed)

## 2016-05-20 NOTE — Patient Instructions (Signed)
A few things to remember from today's visit:   Fibromyalgia  Migraine without aura and without status migrainosus, not intractable  Insomnia, unspecified type  Mixed hyperlipidemia - Plan: Basic metabolic panel, Lipid panel  Routine general medical examination at a health care facility  A few tips:  -As we age balance is not as good as it was, so there is a higher risks for falls. Please remove small rugs and furniture that is "in your way" and could increase the risk of falls. Stretching exercises may help with fall prevention: Yoga and Tai Chi are some examples. Low impact exercise is better, so you are not very achy the next day.  -Sun screen and avoidance of direct sun light recommended. Caution with dehydration, if working outdoors be sure to drink enough fluids.  - Some medications are not safe as we age, increases the risk of side effects and can potentially interact with other medication you are also taken;  including some of over the counter medications. Be sure to let me know when you start a new medication even if it is a dietary/vitamin supplement.   -Healthy diet low in red meet/animal fat and sugar + regular physical activity is recommended.      Please be sure medication list is accurate. If a new problem present, please set up appointment sooner than planned today.

## 2016-05-20 NOTE — Progress Notes (Signed)
Pre visit review using our clinic review tool, if applicable. No additional management support is needed unless otherwise documented below in the visit note. 

## 2016-05-20 NOTE — Progress Notes (Addendum)
    Shelby [name] [birth year] [id number]        76 y.o.  G3P0011 for annual exam.    Past medical history,surgical history, problem list, medications, allergies, family history and social history were all reviewed and documented as reviewed in the EPIC chart.  ROS:  Performed with pertinent positives and negatives included in the history, assessment and plan.   Additional significant findings :  None   Exam: [name] assistant Vitals:   05/20/16 1148  BP: 118/76  Weight: 135 lb (61.2 kg)  Height: 5' 3.5" (1.613 m)   Body mass index is 23.54 kg/m.  General appearance:  Normal affect, orientation and appearance. Skin: Grossly normal HEENT: Without gross lesions.  No cervical or supraclavicular adenopathy. Thyroid normal.  Lungs:  Clear without wheezing, rales or rhonchi Cardiac: RR, without RMG Abdominal:  Soft, nontender, without masses, guarding, rebound, organomegaly or hernia Breasts:  Examined lying and sitting. Left without masses, retractions, discharge or axillary adenopathy. Right status post reconstruction without masses or axillary adenopathy Pelvic:  Ext, BUS, Vagina: With atrophic changes  Adnexa: Without masses or tenderness    Anus and perineum: Normal   Rectovaginal: Normal sphincter tone without palpated masses or tenderness.    Assessment/Plan:  76 y.o. G9P0011 female for annual exam, status post TVH and subsequent laparoscopic BSO for benign serous cystadenoma..   1. Postmenopausal/atrophic genital changes. 2. Osteopenia. DEXA 02/2015 T score -1.8 FRAX 11%/2.4%. Currently on supplemental vitamin D as her value was checked at her primary physician's office and found low. Plan repeat DEXA next year to year interval. 3. Pap smear 2011. No Pap smear done today. No history of abnormal Pap smears. Per current screening guidelines we both agree to stop screening based on hysterectomy and age. 4. Colonoscopy 2015. Repeat at their recommended  interval. 5. Mammography 11/2015. Continue with annual mammography when due. SBE monthly reviewed. 6. Health maintenance. No routine lab work done as patient does this elsewhere. Follow up 1 year, sooner as needed.   Anastasio Auerbach MD, 12:06 PM 05/20/2016

## 2016-05-23 ENCOUNTER — Other Ambulatory Visit: Payer: Self-pay | Admitting: Family Medicine

## 2016-05-23 DIAGNOSIS — E782 Mixed hyperlipidemia: Secondary | ICD-10-CM

## 2016-05-23 MED ORDER — PITAVASTATIN CALCIUM 1 MG PO TABS: 1.0000 mg | ORAL_TABLET | Freq: Every day | ORAL | 3 refills | Status: DC

## 2016-08-02 ENCOUNTER — Encounter: Payer: Self-pay | Admitting: Family Medicine

## 2016-08-02 MED ORDER — DIAZEPAM 5 MG PO TABS: 5.0000 mg | ORAL_TABLET | ORAL | 0 refills | Status: DC | PRN

## 2016-09-09 DIAGNOSIS — L570 Actinic keratosis: Secondary | ICD-10-CM | POA: Diagnosis not present

## 2016-09-09 DIAGNOSIS — H5203 Hypermetropia, bilateral: Secondary | ICD-10-CM | POA: Diagnosis not present

## 2016-09-09 DIAGNOSIS — H2513 Age-related nuclear cataract, bilateral: Secondary | ICD-10-CM | POA: Diagnosis not present

## 2016-09-09 DIAGNOSIS — L905 Scar conditions and fibrosis of skin: Secondary | ICD-10-CM | POA: Diagnosis not present

## 2016-09-09 DIAGNOSIS — L820 Inflamed seborrheic keratosis: Secondary | ICD-10-CM | POA: Diagnosis not present

## 2016-09-09 DIAGNOSIS — Z85828 Personal history of other malignant neoplasm of skin: Secondary | ICD-10-CM | POA: Diagnosis not present

## 2016-09-09 DIAGNOSIS — D3131 Benign neoplasm of right choroid: Secondary | ICD-10-CM | POA: Diagnosis not present

## 2016-09-24 DIAGNOSIS — H11431 Conjunctival hyperemia, right eye: Secondary | ICD-10-CM | POA: Diagnosis not present

## 2016-09-24 DIAGNOSIS — H40051 Ocular hypertension, right eye: Secondary | ICD-10-CM | POA: Diagnosis not present

## 2016-09-24 DIAGNOSIS — H16141 Punctate keratitis, right eye: Secondary | ICD-10-CM | POA: Diagnosis not present

## 2016-09-26 DIAGNOSIS — H11431 Conjunctival hyperemia, right eye: Secondary | ICD-10-CM | POA: Diagnosis not present

## 2016-09-26 DIAGNOSIS — H16141 Punctate keratitis, right eye: Secondary | ICD-10-CM | POA: Diagnosis not present

## 2016-10-15 ENCOUNTER — Other Ambulatory Visit: Payer: Self-pay | Admitting: Gynecology

## 2016-11-19 ENCOUNTER — Other Ambulatory Visit: Payer: Self-pay | Admitting: Gynecology

## 2016-11-19 DIAGNOSIS — Z1231 Encounter for screening mammogram for malignant neoplasm of breast: Secondary | ICD-10-CM

## 2016-12-05 ENCOUNTER — Encounter: Payer: Self-pay | Admitting: Family Medicine

## 2016-12-19 ENCOUNTER — Other Ambulatory Visit: Payer: Self-pay | Admitting: Family Medicine

## 2016-12-19 ENCOUNTER — Encounter: Payer: Self-pay | Admitting: Family Medicine

## 2016-12-19 DIAGNOSIS — G4700 Insomnia, unspecified: Secondary | ICD-10-CM

## 2016-12-19 DIAGNOSIS — I059 Rheumatic mitral valve disease, unspecified: Secondary | ICD-10-CM

## 2016-12-19 DIAGNOSIS — G43009 Migraine without aura, not intractable, without status migrainosus: Secondary | ICD-10-CM

## 2016-12-23 ENCOUNTER — Ambulatory Visit
Admission: RE | Admit: 2016-12-23 | Discharge: 2016-12-23 | Disposition: A | Discharge status: Home / Self Care | Payer: Medicare Other | Source: Ambulatory Visit | Attending: Gynecology | Admitting: Gynecology

## 2016-12-23 DIAGNOSIS — Z1231 Encounter for screening mammogram for malignant neoplasm of breast: Secondary | ICD-10-CM

## 2017-01-10 DIAGNOSIS — K640 First degree hemorrhoids: Secondary | ICD-10-CM | POA: Diagnosis not present

## 2017-01-10 DIAGNOSIS — Z85038 Personal history of other malignant neoplasm of large intestine: Secondary | ICD-10-CM | POA: Diagnosis not present

## 2017-01-10 DIAGNOSIS — Z980 Intestinal bypass and anastomosis status: Secondary | ICD-10-CM | POA: Diagnosis not present

## 2017-04-22 ENCOUNTER — Other Ambulatory Visit: Payer: Self-pay | Admitting: Family Medicine

## 2017-04-22 ENCOUNTER — Encounter: Payer: Self-pay | Admitting: Family Medicine

## 2017-04-22 DIAGNOSIS — I059 Rheumatic mitral valve disease, unspecified: Secondary | ICD-10-CM

## 2017-05-25 NOTE — Progress Notes (Signed)
HPI:   Shelby [name] is a 77 y.o. female, who is here today for her routine physical.  Last CPE: 05/20/2016.  Regular exercise 3 or more time per week: Not consistently. Following a healthy diet: Not consistently.  She lives with her husband. She lives in [place] and comes to [place] once per month with her husband.   Functional Status Survey: Is the patient deaf or have difficulty hearing?: No Does the patient have difficulty seeing, even when wearing glasses/contacts?: No Does the patient have difficulty concentrating, remembering, or making decisions?: No Does the patient have difficulty walking or climbing stairs?: No Does the patient have difficulty dressing or bathing?: No Does the patient have difficulty doing errands alone such as visiting a doctor's office or shopping?: No  Fall Risk  05/26/2017  Falls in the past year? No      Depression screen [facility] 2/9 05/26/2017  Decreased Interest 0  Down, Depressed, Hopeless 0  PHQ - 2 Score 0    Mini-Cog - 05/26/17 0914    Normal clock drawing test?  yes    How many words correct?  3       Vision screening: Left before exam can be done. She follows annually with eye care provider, 06/2016.   Chronic medical problems: Fibromyalgia,insomnia,HLD,vit D def,migraine headaches, colon cancer (2012), and mitral valve prolapse.  Valium as needed for fibromyalgia. She had an exacerbation 2 weeks ago. She has been taking CBD oil and has helped with arthralgias and myalgias.  CBD oil has helped with OA. Insomnia: She is on Amitriptyline 50 mg at bedtime. Still helping. No side effects reported.  HLD: She is on non pharmacologic treatment.  Lab Results  Component Value Date   CHOL 320 (H) 05/20/2016   HDL 46.80 05/20/2016   LDLCALC 197 (H) 05/23/2015   LDLDIRECT 188.0 05/20/2016   TRIG 366.0 (H) 05/20/2016   CHOLHDL 7 05/20/2016   She has not tolerated different statins,including  Livalo.   There is no immunization history on file for this patient.  Mammogram: 12/2016 Colonoscopy: 12/2016.  DEXA: 2016,osteopenia.  Providers she sees regularly:  Gyn Dr Phineas Real. Cardiologist, Dr Ples Specter Dr Prudencio [facility]    Review of Systems  Constitutional: Negative for appetite change, fatigue and fever.  HENT: Negative for hearing loss, mouth sores, trouble swallowing and voice change.   Eyes: Negative for photophobia and visual disturbance.  Respiratory: Negative for cough, shortness of breath and wheezing.   Cardiovascular: Negative for chest pain and leg swelling.  Gastrointestinal: Negative for abdominal pain, nausea and vomiting.       No changes in bowel habits.  Endocrine: Negative for cold intolerance, heat intolerance, polydipsia, polyphagia and polyuria.  Genitourinary: Negative for decreased urine volume, dysuria, hematuria, vaginal bleeding and vaginal discharge.  Musculoskeletal: Positive for arthralgias and myalgias. Negative for back pain, gait problem and neck pain.  Skin: Negative for color change and rash.  Neurological: Negative for syncope, weakness and headaches.  Hematological: Negative for adenopathy. Does not bruise/bleed easily.  Psychiatric/Behavioral: Negative for confusion and sleep disturbance. The patient is not nervous/anxious.   All other systems reviewed and are negative.     Current Outpatient Medications on File Prior to Visit  Medication Sig Dispense Refill  . Ascorbic Acid (VITAMIN C) 1000 MG tablet Take 1,000 mg by mouth daily.    . Calcium Carbonate-Vitamin D (CALCIUM 600 + D PO) Take 1 tablet by mouth 2 (two) times daily.     Marland Kitchen  Cholecalciferol (VITAMIN D PO) Take by mouth.    . Omega-3 Fatty Acids (FISH OIL) 1200 MG CAPS Take 1,200 mg by mouth daily.    . vitamin B-12 (CYANOCOBALAMIN) 1000 MCG tablet Take 1,000 mcg by mouth daily.     No current facility-administered medications on file prior to visit.      Past Medical  History:  Diagnosis Date  . Allergy   . Arthritis   . Asthma   . Breast cancer ([place]) 2001   s/p surgery and chemo   . Colon cancer ([place]) 2012  . Depression   . Eosinophilia   . Heart murmur    mitral valve prolapse  . Hypercholesterolemia    statin intolerant  . Migraine   . MVP (mitral valve prolapse)   . Osteopenia 02/2015   T score -1.8 FRAX 11%/2.4%  . Syncope    secondary to vasovagal phenomenon    Past Surgical History:  Procedure Laterality Date  . APPENDECTOMY  1970  . ARTERY BIOPSY Left 06/10/2012   Procedure: LEFT BIOPSY TEMPORAL ARTERY;  Surgeon: Harl Bowie, MD;  Location: [facility];  Service: General;  Laterality: Left;  . BACK SURGERY  [phone]  . BREAST SURGERY     2000-mastectomy and TRAM with mesh  . COLON RESECTION    . COLON SURGERY    . colonscopy    . COSMETIC SURGERY     face lift  . LAPAROSCOPIC SALPINGOOPHERECTOMY  8.29.12   LAP.W/BSO    . MASTECTOMY  2001  . ROTATOR CUFF REPAIR    . SHOULDER SURGERY  2000  . SPINE SURGERY    . VAGINAL HYSTERECTOMY  1979    Allergies  Allergen Reactions  . Latex Rash  . Benzonatate     REACTION: migraines  . Codeine     REACTION: Nausea  . Morphine And Related     itching  . Pravastatin Other (See Comments)    Stomach Pain  . Skelaxin [Metaxalone] Other (See Comments)    "Makes Pt feel crazy"  . Penicillins Rash  . Sulfonamide Derivatives Rash    Family History  Problem Relation Age of Onset  . Arthritis Mother   . Hyperlipidemia Mother   . Heart disease Father   . Heart attack Father   . Hypertension Father   . Cancer Son        rectal cancer-died age 60  . Breast cancer Other 36  . Diabetes Paternal Aunt   . Stroke Paternal Grandfather     Social History   Socioeconomic History  . Marital status: Married    Spouse name: None  . Number of children: None  . Years of education: None  . Highest education level: None  Social Needs  . Financial resource strain: None  . Food  insecurity - worry: None  . Food insecurity - inability: None  . Transportation needs - medical: None  . Transportation needs - non-medical: None  Occupational History  . None  Tobacco Use  . Smoking status: Never Smoker  . Smokeless tobacco: Never Used  Substance and Sexual Activity  . Alcohol use: Yes    Alcohol/week: 0.6 oz    Types: 1 Glasses of wine per week  . Drug use: No  . Sexual activity: Not Currently    Comment: 1st intercourse 77 yo-Fewer than 5 partners  Other Topics Concern  . None  Social History Narrative  . None     Vitals:   05/26/17 0839  BP:  120/78  Pulse: 85  Resp: 12  Temp: 97.8 F (36.6 C)  SpO2: 95%   Body mass index is 23.8 kg/m.   Wt Readings from Last 3 Encounters:  05/26/17 136 lb 8 oz (61.9 kg)  05/20/16 135 lb (61.2 kg)  05/20/16 135 lb 4 oz (61.3 kg)     Physical Exam  Nursing note and vitals reviewed. Constitutional: She is oriented to person, place, and time. She appears well-developed and well-nourished. No distress.  HENT:  Head: Normocephalic and atraumatic.  Right Ear: Hearing, tympanic membrane, external ear and ear canal normal.  Left Ear: Hearing, tympanic membrane, external ear and ear canal normal.  Mouth/Throat: Uvula is midline, oropharynx is clear and moist and mucous membranes are normal.  Eyes: Conjunctivae and EOM are normal. Pupils are equal, round, and reactive to light.  Neck: No tracheal deviation present. No thyromegaly present.  Cardiovascular: Normal rate and regular rhythm.  No murmur heard. Pulses:      Dorsalis pedis pulses are 2+ on the right side, and 2+ on the left side.  Respiratory: Effort normal and breath sounds normal. No respiratory distress.  GI: Soft. She exhibits no mass. There is no hepatomegaly. There is no tenderness.  Genitourinary:  Genitourinary Comments: Deferred to gyn.  Musculoskeletal: She exhibits no edema.  No major deformity or sing of synovitis appreciated.   Lymphadenopathy:    She has no cervical adenopathy.       Right: No supraclavicular adenopathy present.       Left: No supraclavicular adenopathy present.  Neurological: She is alert and oriented to person, place, and time. She has normal strength. No cranial nerve deficit. Coordination and gait normal.  Reflex Scores:      Bicep reflexes are 2+ on the right side and 2+ on the left side.      Patellar reflexes are 2+ on the right side and 2+ on the left side. Skin: Skin is warm. No rash noted. No erythema.  Psychiatric: She has a normal mood and affect. Her speech is normal.  Well groomed, good eye contact.     ASSESSMENT AND PLAN:  Shelby [name] was here today annual physical examination.   Orders Placed This Encounter  Procedures  . Lipid panel  . Basic metabolic panel  . LDL cholesterol, direct    Lab Results  Component Value Date   CHOL 283 (H) 05/26/2017   HDL 51.80 05/26/2017   LDLCALC 197 (H) 05/23/2015   LDLDIRECT 176.0 05/26/2017   TRIG 218.0 (H) 05/26/2017   CHOLHDL 5 05/26/2017   Lab Results  Component Value Date   CREATININE 0.87 05/26/2017   BUN 16 05/26/2017   NA 139 05/26/2017   K 5.0 05/26/2017   CL 101 05/26/2017   CO2 30 05/26/2017     Routine general medical examination at a health care facility  Preventive guidelines reviewed. Vaccination not up to date,refused Prevnar 13.  Ca++ and vit D supplementation to continue. Next CPE in a year.   The 10-year ASCVD risk score Mikey Bussing DC Brooke Bonito., et al., 2013) is: 17.6%   Values used to calculate the score:     Age: 73 years     Sex: Female     Is Non-Hispanic African American: No     Diabetic: No     Tobacco smoker: No     Systolic Blood Pressure: 390 mmHg     Is BP treated: No     HDL Cholesterol: 51.8 mg/dL  Total Cholesterol: 283 mg/dL  Medicare annual wellness visit, subsequent  We discussed the importance of staying active, physically and mentally, as well as the  benefits of a healthy/balance diet. Low impact exercise that involve stretching and strengthing are ideal.  We discussed preventive screening for the next 5-10 years, summery of recommendations given in AVS: Colon cancer screening every 3-5 years due to Hx of colon cancer.  Annual eye exam and glaucoma screening, and diabetes screening. Fall prevention.  Advance directives and end of life discussed, she has POA and living will.    Mixed hyperlipidemia  Continue non pharmacologic treatment, will follow labs done today and will give further recommendations accordingly.  -     Lipid panel -     Basic metabolic panel -     LDL cholesterol, direct  Mitral valve disorder  Continue following with cardiologist.  -     atenolol (TENORMIN) 25 MG tablet; Take 1 tablet (25 mg total) by mouth daily.  Insomnia, unspecified type  Well controlled. No changes in current management. F/U in 12 months.  -     amitriptyline (ELAVIL) 50 MG tablet; Take 1 tablet (50 mg total) by mouth at bedtime.  Fibromyalgia  Stable. No changes in current management. F/U in 12 months.  -     diazepam (VALIUM) 5 MG tablet; Take 1 tablet (5 mg total) by mouth as needed for anxiety. -     amitriptyline (ELAVIL) 50 MG tablet; Take 1 tablet (50 mg total) by mouth at bedtime.    Return in 1 year (on 05/27/2018).      Draydon Clairmont G. Martinique, MD  [facility]. [place] office.

## 2017-05-26 ENCOUNTER — Ambulatory Visit (INDEPENDENT_AMBULATORY_CARE_PROVIDER_SITE_OTHER): Payer: Medicare Other | Admitting: Family Medicine

## 2017-05-26 ENCOUNTER — Encounter: Payer: Self-pay | Admitting: Family Medicine

## 2017-05-26 VITALS — BP 120/78 | HR 85 | Temp 97.8°F | Resp 12 | Ht 63.5 in | Wt 136.5 lb

## 2017-05-26 DIAGNOSIS — E782 Mixed hyperlipidemia: Secondary | ICD-10-CM | POA: Diagnosis not present

## 2017-05-26 DIAGNOSIS — I059 Rheumatic mitral valve disease, unspecified: Secondary | ICD-10-CM | POA: Diagnosis not present

## 2017-05-26 DIAGNOSIS — Z0000 Encounter for general adult medical examination without abnormal findings: Secondary | ICD-10-CM

## 2017-05-26 DIAGNOSIS — G4700 Insomnia, unspecified: Secondary | ICD-10-CM

## 2017-05-26 DIAGNOSIS — M797 Fibromyalgia: Secondary | ICD-10-CM

## 2017-05-26 LAB — LIPID PANEL
CHOL/HDL RATIO: 5
CHOLESTEROL: 283 mg/dL — AB (ref 0–200)
HDL: 51.8 mg/dL (ref >39.00)
NonHDL: 231.13
Triglycerides: 218 mg/dL — ABNORMAL HIGH (ref 0.0–149.0)
VLDL: 43.6 mg/dL — AB (ref 0.0–40.0)

## 2017-05-26 LAB — BASIC METABOLIC PANEL
BUN: 16 mg/dL (ref 6–23)
CALCIUM: 10.7 mg/dL — AB (ref 8.4–10.5)
CHLORIDE: 101 meq/L (ref 96–112)
CO2: 30 mEq/L (ref 19–32)
CREATININE: 0.87 mg/dL (ref 0.40–1.20)
GFR: 67.01 mL/min (ref >60.00)
Glucose, Bld: 91 mg/dL (ref 70–99)
Potassium: 5 mEq/L (ref 3.5–5.1)
Sodium: 139 mEq/L (ref 135–145)

## 2017-05-26 LAB — LDL CHOLESTEROL, DIRECT: LDL DIRECT: 176 mg/dL

## 2017-05-26 MED ORDER — ATENOLOL 25 MG PO TABS: 25.0000 mg | ORAL_TABLET | Freq: Every day | ORAL | 3 refills | Status: DC

## 2017-05-26 MED ORDER — DIAZEPAM 5 MG PO TABS: 5.0000 mg | ORAL_TABLET | ORAL | 0 refills | Status: AC | PRN

## 2017-05-26 MED ORDER — AMITRIPTYLINE HCL 50 MG PO TABS: 50.0000 mg | ORAL_TABLET | Freq: Every day | ORAL | 3 refills | Status: DC

## 2017-05-26 NOTE — Patient Instructions (Addendum)
A few things to remember from today's visit:   Mixed hyperlipidemia - Plan: Lipid panel, Basic metabolic panel  Routine general medical examination at a health care facility  Mitral valve disorder - Plan: atenolol (TENORMIN) 25 MG tablet  Insomnia, unspecified type - Plan: amitriptyline (ELAVIL) 50 MG tablet  Migraine without aura and without status migrainosus, not intractable - Plan: amitriptyline (ELAVIL) 50 MG tablet   A few tips:  -As we age balance is not as good as it was, so there is a higher risks for falls. Please remove small rugs and furniture that is "in your way" and could increase the risk of falls. Stretching exercises may help with fall prevention: Yoga and Tai Chi are some examples. Low impact exercise is better, so you are not very achy the next day.  -Sun screen and avoidance of direct sun light recommended. Caution with dehydration, if working outdoors be sure to drink enough fluids.  - Some medications are not safe as we age, increases the risk of side effects and can potentially interact with other medication you are also taken;  including some of over the counter medications. Be sure to let me know when you start a new medication even if it is a dietary/vitamin supplement.   -Healthy diet low in red meet/animal fat and sugar + regular physical activity is recommended.     Screening schedule for the next 5-10 years:  Colonoscopy  Glaucoma screening/eye exam every 1-2 years.  Mammogram for breast cancer screening annually.  Flu vaccine annually.  Diabetes and cholesterol screening   Fall prevention   Advance directives:  Please see a lawyer and/or go to this website to help you with advanced directives and designating a health care power of attorney so that your wishes will be followed should you become too ill to make your own medical decisions.  [email or web address]       Please be sure medication list is accurate. If a  new problem present, please set up appointment sooner than planned today.

## 2017-06-01 ENCOUNTER — Encounter: Payer: Self-pay | Admitting: Family Medicine

## 2017-06-09 ENCOUNTER — Telehealth: Payer: Self-pay | Admitting: Family Medicine

## 2017-06-09 ENCOUNTER — Encounter: Payer: Self-pay | Admitting: Family Medicine

## 2017-06-09 NOTE — Telephone Encounter (Signed)
Copied from [place]. Topic: Quick Communication - Rx Refill/Question >> Jun 09, 2017  1:38 PM Neva Seat wrote: diazepam (VALIUM) 5 MG tablet  Needs to verify Rx for pt to have Rx refilled.  [id number] [place], [place] [address] Phone: [phone] Fax: [phone]

## 2017-06-10 ENCOUNTER — Encounter: Payer: Self-pay | Admitting: Family Medicine

## 2017-06-10 NOTE — Telephone Encounter (Signed)
Spoke with pharmacy, needed to verify [place]. Pharmacy stated that it has been taking care of.

## 2017-06-10 NOTE — Telephone Encounter (Signed)
Please verify with pt,I gave her a Rx for Valium during her last OV.  Thanks

## 2017-06-10 NOTE — Telephone Encounter (Signed)
Valium 5 mg refill request  Needs to verify Rx for pt to have Rx refilled.  Pt of Dr. [place]  [id number] [address] [place], [place].   [facility]  [phone] Fax [phone]

## 2017-06-11 ENCOUNTER — Encounter: Payer: Self-pay | Admitting: Family Medicine

## 2017-06-19 DIAGNOSIS — R079 Chest pain, unspecified: Secondary | ICD-10-CM | POA: Diagnosis not present

## 2017-06-19 DIAGNOSIS — Z8709 Personal history of other diseases of the respiratory system: Secondary | ICD-10-CM | POA: Diagnosis not present

## 2017-06-19 DIAGNOSIS — R070 Pain in throat: Secondary | ICD-10-CM | POA: Diagnosis not present

## 2017-06-19 DIAGNOSIS — R0600 Dyspnea, unspecified: Secondary | ICD-10-CM | POA: Diagnosis not present

## 2017-06-19 DIAGNOSIS — R05 Cough: Secondary | ICD-10-CM | POA: Diagnosis not present

## 2017-06-26 DIAGNOSIS — R05 Cough: Secondary | ICD-10-CM | POA: Diagnosis not present

## 2017-06-26 DIAGNOSIS — I10 Essential (primary) hypertension: Secondary | ICD-10-CM | POA: Diagnosis not present

## 2017-06-26 DIAGNOSIS — R0602 Shortness of breath: Secondary | ICD-10-CM | POA: Diagnosis not present

## 2017-06-26 DIAGNOSIS — R062 Wheezing: Secondary | ICD-10-CM | POA: Diagnosis not present

## 2018-06-05 ENCOUNTER — Other Ambulatory Visit: Payer: Self-pay | Admitting: Family Medicine

## 2018-06-05 DIAGNOSIS — M797 Fibromyalgia: Secondary | ICD-10-CM

## 2018-06-05 DIAGNOSIS — I059 Rheumatic mitral valve disease, unspecified: Secondary | ICD-10-CM

## 2018-06-05 DIAGNOSIS — G4700 Insomnia, unspecified: Secondary | ICD-10-CM

## 2018-06-17 ENCOUNTER — Encounter: Payer: Self-pay | Admitting: Family Medicine

## 2018-12-24 ENCOUNTER — Encounter: Payer: Self-pay | Admitting: Gynecology

## 2019-05-27 ENCOUNTER — Other Ambulatory Visit: Payer: Self-pay | Admitting: Family Medicine

## 2019-05-27 DIAGNOSIS — G4700 Insomnia, unspecified: Secondary | ICD-10-CM

## 2019-05-27 DIAGNOSIS — I059 Rheumatic mitral valve disease, unspecified: Secondary | ICD-10-CM

## 2019-05-27 DIAGNOSIS — M797 Fibromyalgia: Secondary | ICD-10-CM
